# Patient Record
Sex: Male | Born: 1975 | Race: White | Hispanic: No | Marital: Married | Smoking: Current every day smoker
Health system: Southern US, Community
[De-identification: ages and names within clinical notes are randomized; demographics above are authoritative.]

## PROBLEM LIST (undated history)

## (undated) DIAGNOSIS — T7840XA Allergy, unspecified, initial encounter: Secondary | ICD-10-CM

## (undated) DIAGNOSIS — F319 Bipolar disorder, unspecified: Secondary | ICD-10-CM

## (undated) HISTORY — DX: Allergy, unspecified, initial encounter: T78.40XA

## (undated) HISTORY — PX: HERNIA REPAIR: SHX51

## (undated) HISTORY — DX: Bipolar disorder, unspecified: F31.9

---

## 2012-09-27 ENCOUNTER — Encounter (HOSPITAL_BASED_OUTPATIENT_CLINIC_OR_DEPARTMENT_OTHER): Payer: Self-pay | Admitting: *Deleted

## 2012-09-27 ENCOUNTER — Emergency Department (HOSPITAL_BASED_OUTPATIENT_CLINIC_OR_DEPARTMENT_OTHER)
Admission: EM | Admit: 2012-09-27 | Discharge: 2012-09-28 | Disposition: A | Discharge status: Home / Self Care | Payer: Federal, State, Local not specified - PPO | Attending: Emergency Medicine | Admitting: Emergency Medicine

## 2012-09-27 DIAGNOSIS — IMO0002 Reserved for concepts with insufficient information to code with codable children: Secondary | ICD-10-CM | POA: Insufficient documentation

## 2012-09-27 DIAGNOSIS — Y93G9 Activity, other involving cooking and grilling: Secondary | ICD-10-CM | POA: Insufficient documentation

## 2012-09-27 DIAGNOSIS — W268XXA Contact with other sharp object(s), not elsewhere classified, initial encounter: Secondary | ICD-10-CM | POA: Insufficient documentation

## 2012-09-27 DIAGNOSIS — Y9289 Other specified places as the place of occurrence of the external cause: Secondary | ICD-10-CM | POA: Insufficient documentation

## 2012-09-27 DIAGNOSIS — Z23 Encounter for immunization: Secondary | ICD-10-CM | POA: Insufficient documentation

## 2012-09-27 DIAGNOSIS — S68119A Complete traumatic metacarpophalangeal amputation of unspecified finger, initial encounter: Secondary | ICD-10-CM

## 2012-09-27 DIAGNOSIS — Z79899 Other long term (current) drug therapy: Secondary | ICD-10-CM | POA: Insufficient documentation

## 2012-09-27 NOTE — ED Notes (Signed)
Patient cut his finger on a vegetable slicer, tip of his right middle finger

## 2012-09-27 NOTE — ED Notes (Signed)
Pt avulsed the tip of his right third finger on a vegetable slicer. Bandage is stuck to same. Finger is soaking in betadine/water solution to loosen bandage.

## 2012-09-28 MED ORDER — CEPHALEXIN 250 MG PO CAPS
1000.0000 mg | ORAL_CAPSULE | Freq: Once | ORAL | Status: AC
  Administered 2012-09-28: 1000 mg via ORAL
  Filled 2012-09-28: qty 4

## 2012-09-28 MED ORDER — CEPHALEXIN 500 MG PO CAPS: 500.0000 mg | ORAL_CAPSULE | Freq: Four times a day (QID) | ORAL | Status: DC

## 2012-09-28 MED ORDER — TETANUS-DIPHTH-ACELL PERTUSSIS 5-2.5-18.5 LF-MCG/0.5 IM SUSP
0.5000 mL | Freq: Once | INTRAMUSCULAR | Status: AC
  Administered 2012-09-28: 0.5 mL via INTRAMUSCULAR
  Filled 2012-09-28: qty 0.5

## 2012-09-28 NOTE — ED Notes (Signed)
MD at bedside. 

## 2012-09-28 NOTE — ED Provider Notes (Signed)
  CSN: [id number]     Arrival date & time 09/27/12  2146 History     First MD Initiated Contact with Patient 09/28/12 0018     Chief Complaint  Patient presents with  . Finger Injury   (Consider location/radiation/quality/duration/timing/severity/associated sxs/prior Treatment) HPI This is a 37 year old male who sliced the tip of his right index finger off with a vegetable slicer just prior to arrival. There was moderate pain at the time but that is improved. Bleeding has been controlled. The wound was cleansed by nursing staff prior to my violation. His tetanus is up-to-date. The wound is superficial and does not impair movement of his finger. He denies other injury.  History reviewed. No pertinent past medical history. Past Surgical History  Procedure Laterality Date  . Hernia repair     No family history on file. History  Substance Use Topics  . Smoking status: Never Smoker   . Smokeless tobacco: Not on file  . Alcohol Use: No    Review of Systems  All other systems reviewed and are negative.    Allergies  Review of patient's allergies indicates no known allergies.  Home Medications   Current Outpatient Rx  Name  Route  Sig  Dispense  Refill  . montelukast (SINGULAIR) 10 MG tablet   Oral   Take 10 mg by mouth at bedtime.          BP 135/87  Pulse 73  Temp(Src) 98.2 F (36.8 C) (Oral)  Resp 18  SpO2 98%  Physical Exam General: Well-developed, well-nourished male in no acute distress; appearance consistent with age of record HENT: normocephalic, atraumatic Eyes: normal appearing Neck: supple Heart: regular rate and rhythm Lungs: clear to auscultation bilaterally Abdomen: soft; nondistended; nontender; bowel sounds present Extremities: No deformity; full range of motion Neurologic: Awake, alert and oriented; motor function intact in all extremities and symmetric; no facial droop Skin: Warm and dry; superficial tip amputation of right middle  finger Psychiatric: Normal mood and affect    ED Course   Procedures (including critical care time)    MDM    Hanley Seamen, MD 09/28/12 4098

## 2012-09-28 NOTE — ED Notes (Signed)
Per Dr. Read Drivers, wound covered with xeroform and gauze then wrapped with kerlex.

## 2016-04-06 ENCOUNTER — Ambulatory Visit (INDEPENDENT_AMBULATORY_CARE_PROVIDER_SITE_OTHER): Payer: Federal, State, Local not specified - PPO | Admitting: Family Medicine

## 2016-04-06 ENCOUNTER — Other Ambulatory Visit: Payer: Self-pay | Admitting: Family Medicine

## 2016-04-06 ENCOUNTER — Encounter: Payer: Self-pay | Admitting: Family Medicine

## 2016-04-06 VITALS — BP 128/81 | HR 78 | Temp 98.2°F | Ht 75.0 in | Wt 218.0 lb

## 2016-04-06 DIAGNOSIS — W57XXXA Bitten or stung by nonvenomous insect and other nonvenomous arthropods, initial encounter: Secondary | ICD-10-CM | POA: Diagnosis not present

## 2016-04-06 DIAGNOSIS — M795 Residual foreign body in soft tissue: Secondary | ICD-10-CM | POA: Diagnosis not present

## 2016-04-06 NOTE — Progress Notes (Signed)
Pre visit review using our clinic review tool, if applicable. No additional management support is needed unless otherwise documented below in the visit note. 

## 2016-04-06 NOTE — Patient Instructions (Signed)
Do not shower for the rest of the day. When you do wash it, use only soap and water. Do not vigorously scrub. Apply triple antibiotic ointment (like Neosporin) twice daily. Keep the area clean and dry.   Things to look out for: increasing pain not relieved by ibuprofen/acetaminophen, fevers, spreading redness, drainage of pus, or foul odor.  

## 2016-04-06 NOTE — Progress Notes (Signed)
Chief Complaint  Patient presents with  . Establish Care    Pt reports having tick bite LT under arm and has not been removed x2 days       New Patient Visit SUBJECTIVE: HPI: Christian Baldwin is an 41 y.o.male who is being seen for establishing care.  The patient noticed a tick stuck to his L armpit region around 2 days ago. He has had tick bites in the past, but notes that it resolves much sooner. He has not noticed a "bulls-eye" rash, fevers, or drainage. His wife tried to take off the tick, but failed and now he believes it is burrowed underneath his skin. He would like it taken out. [place] and denies recent travel to an area endemic with Lyme Disease.  No Known Allergies  Past Medical History:  Diagnosis Date  . Allergy    Past Surgical History:  Procedure Laterality Date  . HERNIA REPAIR     Social History   Social History  . Marital status: Married   Social History Main Topics  . Smoking status: Never Smoker  . Smokeless tobacco: Never Used  . Alcohol use No  . Drug use: Unknown   Family History  Problem Relation Age of Onset  . Cancer Neg Hx      Current Outpatient Prescriptions:  .  albuterol (PROVENTIL HFA;VENTOLIN HFA) 108 (90 Base) MCG/ACT inhaler, Inhale 1 puff into the lungs as needed., Disp: , Rfl:  .  fluticasone (FLONASE) 50 MCG/ACT nasal spray, Place 1 spray into the nose daily., Disp: , Rfl:  .  montelukast (SINGULAIR) 10 MG tablet, Take 10 mg by mouth at bedtime., Disp: , Rfl:   ROS Skin:  As noted in HPI  Const: Denies fevers   OBJECTIVE: BP 128/81 (BP Location: Left Arm, Patient Position: Sitting, Cuff Size: Small)   Pulse 78   Temp 98.2 F (36.8 C) (Oral)   Ht 6\' 3"  (1.905 m)   Wt 218 lb (98.9 kg)   SpO2 97%   BMI 27.25 kg/m   Constitutional: -  VS reviewed -  Well developed, well nourished, appears stated age -  No apparent distress  Psychiatric: -  Oriented to person, place, and time -  Memory intact -   Affect and mood normal -  Fluent conversation, good eye contact -  Judgment and insight age appropriate  Eye: -  Conjunctivae clear, no discharge -  Pupils symmetric, round, reactive to light  Cardiovascular: -  RRR, no murmurs -  No LE edema  Respiratory: -  Normal respiratory effort, no accessory muscle use, no retraction -  Breath sounds equal, no wheezes, no ronchi, no crackles  Musculoskeletal: -  No clubbing, no cyanosis -  Gait normal  Skin: -  Over L axillary region, there is an area of edema and erythema. There is a central area with a small amount of black in view. I do not appreciate any fluctuance or drainage. No significant TTP. -  Warm and dry to palpation   Procedure note; FB removal Informed consent was obtained. The area was cleaned with alcohol and injected with 4 mL of 1% lidocaine without epinephrine. The area was then cleaned with betadine. A sterile field was then created with a fenestrated drape. Sterile gloves used. A 15 blade scalpel was then used to make an elliptical excision around the area of interest. Forceps were used to create traction to enhance dissection of the tissue planes and removal of the  specimen. Adequate hemostasis assured with electrical cauterization. 3 Simple 5-0 nylon sutures were placed with good approximation of wound edges. The area was dressed with triple antibiotic ointment and a bandage. There were no complications noted. The patient tolerated the procedure well.   ASSESSMENT/PLAN: Tick bite, initial encounter  Foreign body (FB) in soft tissue - Plan: PR REMOVAL OF FOREIGN BODY  As we live in an area with low incidence of Lyme Disease, no indication for bacterial prophylaxis. I did not think I could adequately remove the remnants of the tick without excision. Patient should return in 1 week for suture removal. The patient voiced understanding and agreement to the plan.   Jilda Roche [place], DO 04/06/16  4:07 PM

## 2016-04-13 ENCOUNTER — Ambulatory Visit (INDEPENDENT_AMBULATORY_CARE_PROVIDER_SITE_OTHER): Payer: Federal, State, Local not specified - PPO | Admitting: Family Medicine

## 2016-04-13 ENCOUNTER — Encounter: Payer: Self-pay | Admitting: Family Medicine

## 2016-04-13 VITALS — BP 108/70 | HR 82 | Temp 98.3°F | Ht 75.0 in | Wt 218.4 lb

## 2016-04-13 DIAGNOSIS — M795 Residual foreign body in soft tissue: Secondary | ICD-10-CM

## 2016-04-13 NOTE — Progress Notes (Signed)
Pt here for suture removal. It appears the area had opened up more than when I saw him last week. I am worried it will splay more if I remove the center suture. Will have him return in 1 week for removal. Recommended continuing triple abx ointment and keeping area clean and dry. Butterfly bandages also recommended.   Pt voiced understanding and agreement to the plan.   Jilda Rocheicholas [place], DO 04/13/16 2:13 PM

## 2016-04-13 NOTE — Progress Notes (Signed)
Pre visit review using our clinic review tool, if applicable. No additional management support is needed unless otherwise documented below in the visit note. 

## 2016-04-20 ENCOUNTER — Ambulatory Visit (INDEPENDENT_AMBULATORY_CARE_PROVIDER_SITE_OTHER): Payer: Federal, State, Local not specified - PPO | Admitting: Family Medicine

## 2016-04-20 DIAGNOSIS — Z4802 Encounter for removal of sutures: Secondary | ICD-10-CM

## 2016-04-20 NOTE — Progress Notes (Signed)
Pt here for suture removal. It did splay open a little bit, probably due to increased traction in area. No erythema, drainage, or fluctuance. 3 simple sutures removed. Pt tolerated well. No complications. Follow up as needed.  Pt voiced understanding and agreement to the plan.  Jilda Rocheicholas Paul [place], DO 04/20/16 12:01 PM

## 2016-04-20 NOTE — Progress Notes (Signed)
Pre visit review using our clinic review tool, if applicable. No additional management support is needed unless otherwise documented below in the visit note. 

## 2017-01-07 ENCOUNTER — Encounter: Payer: Self-pay | Admitting: Family Medicine

## 2017-01-07 ENCOUNTER — Ambulatory Visit (INDEPENDENT_AMBULATORY_CARE_PROVIDER_SITE_OTHER): Payer: Federal, State, Local not specified - PPO | Admitting: Family Medicine

## 2017-01-07 VITALS — BP 108/70 | HR 77 | Temp 98.1°F | Ht 75.0 in | Wt 207.4 lb

## 2017-01-07 DIAGNOSIS — Z0000 Encounter for general adult medical examination without abnormal findings: Secondary | ICD-10-CM | POA: Diagnosis not present

## 2017-01-07 LAB — COMPREHENSIVE METABOLIC PANEL
ALBUMIN: 4.7 g/dL (ref 3.5–5.2)
ALK PHOS: 87 U/L (ref 39–117)
ALT: 67 U/L — ABNORMAL HIGH (ref 0–53)
AST: 32 U/L (ref 0–37)
BILIRUBIN TOTAL: 0.7 mg/dL (ref 0.2–1.2)
BUN: 12 mg/dL (ref 6–23)
CO2: 29 mEq/L (ref 19–32)
Calcium: 9.7 mg/dL (ref 8.4–10.5)
Chloride: 104 mEq/L (ref 96–112)
Creatinine, Ser: 0.78 mg/dL (ref 0.40–1.50)
GFR: 117.42 mL/min (ref >60.00)
Glucose, Bld: 93 mg/dL (ref 70–99)
Potassium: 4.2 mEq/L (ref 3.5–5.1)
SODIUM: 138 meq/L (ref 135–145)
TOTAL PROTEIN: 7.4 g/dL (ref 6.0–8.3)

## 2017-01-07 LAB — LIPID PANEL
CHOL/HDL RATIO: 5
CHOLESTEROL: 186 mg/dL (ref 0–200)
HDL: 39.9 mg/dL (ref >39.00)
LDL CALC: 130 mg/dL — AB (ref 0–99)
NonHDL: 145.87
Triglycerides: 80 mg/dL (ref 0.0–149.0)
VLDL: 16 mg/dL (ref 0.0–40.0)

## 2017-01-07 LAB — CBC
HEMATOCRIT: 47.8 % (ref 39.0–52.0)
Hemoglobin: 16.1 g/dL (ref 13.0–17.0)
MCHC: 33.6 g/dL (ref 30.0–36.0)
MCV: 94.9 fl (ref 78.0–100.0)
Platelets: 187 10*3/uL (ref 150.0–400.0)
RBC: 5.04 Mil/uL (ref 4.22–5.81)
RDW: 12.4 % (ref 11.5–15.5)
WBC: 7.1 10*3/uL (ref 4.0–10.5)

## 2017-01-07 MED ORDER — FLUTICASONE PROPIONATE 50 MCG/ACT NA SUSP: 2.0000 | Freq: Every day | NASAL | 12 refills | Status: DC

## 2017-01-07 NOTE — Progress Notes (Signed)
Pre visit review using our clinic review tool, if applicable. No additional management support is needed unless otherwise documented below in the visit note. 

## 2017-01-07 NOTE — Progress Notes (Signed)
Chief Complaint  Patient presents with  . Annual Exam    sleep problems    Well Male Christian Baldwin is here for a complete physical.   His last physical was >1 year ago.  Current diet: in general, a "healthy" diet   Current exercise: Walking, carrying firewood Weight trend: stable Does pt snore? No. Daytime fatigue? No. Seat belt? Yes.    Health maintenance Tetanus- Yes HIV- Yes  Past Medical History:  Diagnosis Date  . Allergy     Past Surgical History:  Procedure Laterality Date  . HERNIA REPAIR     Medications  Current Outpatient Medications on File Prior to Visit  Medication Sig Dispense Refill  . albuterol (PROVENTIL HFA;VENTOLIN HFA) 108 (90 Base) MCG/ACT inhaler Inhale 1 puff into the lungs as needed.    . fluticasone (FLONASE) 50 MCG/ACT nasal spray Place 1 spray into the nose daily.    . montelukast (SINGULAIR) 10 MG tablet Take 10 mg by mouth at bedtime.     Allergies No Known Allergies   Family History Family History  Problem Relation Age of Onset  . Cancer Neg Hx     Review of Systems: Constitutional: no fevers or chills Eye:  no recent significant change in vision Ear/Nose/Mouth/Throat:  Ears: +tinnitus, no hearing loss Nose/Mouth/Throat:  no complaints of nasal congestion or bleeding, no sore throat Cardiovascular:  no chest pain, no palpitations Respiratory:  no cough and no shortness of breath Gastrointestinal:  no abdominal pain, no change in bowel habits, no nausea, vomiting, diarrhea, or constipation and no black or bloody stool GU:  Male: negative for dysuria, frequency, and incontinence and negative for prostate symptoms Musculoskeletal/Extremities:  no pain, redness, or swelling of the joints Integumentary (Skin/Breast):  no abnormal skin lesions reported Neurologic:  no headaches, no numbness, tingling Endocrine: No unexpected weight changes Hematologic/Lymphatic:  no night sweats  Exam BP 108/70 (BP Location: Left Arm, Patient  Position: Sitting, Cuff Size: Large)   Pulse 77   Temp 98.1 F (36.7 C) (Oral)   Ht 6\' 3"  (1.905 m)   Wt 207 lb 6 oz (94.1 kg)   SpO2 96%   BMI 25.92 kg/m  General:  well developed, well nourished, in no apparent distress Skin:  no significant moles, warts, or growths Head:  no masses, lesions, or tenderness Eyes:  pupils equal and round, sclera anicteric without injection Ears:  canals without lesions, TMs shiny without retraction, no obvious effusion, no erythema Nose:  nares patent, septum midline, mucosa normal Throat/Pharynx:  lips and gingiva without lesion; tongue and uvula midline; non-inflamed pharynx; no exudates or postnasal drainage Neck: neck supple without adenopathy, thyromegaly, or masses Lungs:  clear to auscultation, breath sounds equal bilaterally, no respiratory distress Cardio:  regular rate and rhythm without murmurs, heart sounds without clicks or rubs Abdomen:  abdomen soft, nontender; bowel sounds normal; no masses or organomegaly Genital (male): Nml penis, no lesions or discharge; testes present bilaterally without masses or tenderness Rectal: Deferred Musculoskeletal:  symmetrical muscle groups noted without atrophy or deformity Extremities:  no clubbing, cyanosis, or edema, no deformities, no skin discoloration Neuro:  gait normal; deep tendon reflexes normal and symmetric Psych: well oriented with normal range of affect and appropriate judgment/insight  Assessment and Plan  Well adult exam - Plan: Lipid panel, CBC, Comprehensive metabolic panel   Well 41 y.o. male. Counseled on diet and exercise. Other orders as above. Follow up in 1 year pending the above workup. The patient voiced understanding and  agreement to the plan.  Jilda Rocheicholas [place], DO 01/07/17 12:09 PM

## 2017-01-07 NOTE — Patient Instructions (Addendum)
Coping skills Choose 5 that work for you:  Take a deep breath  Count to 20  Read a book  Do a puzzle  Meditate  Bake  Sing  Knit  Garden  Pray  Go outside  Call a friend  Listen to music  Take a walk  Color  Send a note  Take a bath  Watch a movie  Be alone in a quiet place  Pet an animal  Visit a friend  Journal  Exercise  Stretch   Give us 2-3 business days to get the results of your labs back. If labs are normal, you will likely receive a letter in the mail unless you have MyChart. This can take longer than 2-3 business days.   Let us know if you need anything.

## 2017-01-08 ENCOUNTER — Other Ambulatory Visit: Payer: Self-pay | Admitting: Family Medicine

## 2017-01-08 DIAGNOSIS — R740 Nonspecific elevation of levels of transaminase and lactic acid dehydrogenase [LDH]: Principal | ICD-10-CM

## 2017-01-08 DIAGNOSIS — R7401 Elevation of levels of liver transaminase levels: Secondary | ICD-10-CM

## 2017-01-15 ENCOUNTER — Other Ambulatory Visit (INDEPENDENT_AMBULATORY_CARE_PROVIDER_SITE_OTHER): Payer: Federal, State, Local not specified - PPO

## 2017-01-15 ENCOUNTER — Other Ambulatory Visit: Payer: Self-pay | Admitting: Family Medicine

## 2017-01-15 DIAGNOSIS — R740 Nonspecific elevation of levels of transaminase and lactic acid dehydrogenase [LDH]: Secondary | ICD-10-CM | POA: Diagnosis not present

## 2017-01-15 DIAGNOSIS — R7401 Elevation of levels of liver transaminase levels: Secondary | ICD-10-CM

## 2017-01-15 LAB — HEPATIC FUNCTION PANEL
ALBUMIN: 4.7 g/dL (ref 3.5–5.2)
ALT: 65 U/L — AB (ref 0–53)
AST: 30 U/L (ref 0–37)
Alkaline Phosphatase: 92 U/L (ref 39–117)
Bilirubin, Direct: 0.1 mg/dL (ref 0.0–0.3)
TOTAL PROTEIN: 7.2 g/dL (ref 6.0–8.3)
Total Bilirubin: 0.8 mg/dL (ref 0.2–1.2)

## 2017-01-15 NOTE — Progress Notes (Signed)
Elevated ALT labs ordered. Pt to be notified.

## 2017-01-20 ENCOUNTER — Other Ambulatory Visit: Payer: Self-pay | Admitting: Family Medicine

## 2017-01-20 DIAGNOSIS — R740 Nonspecific elevation of levels of transaminase and lactic acid dehydrogenase [LDH]: Principal | ICD-10-CM

## 2017-01-20 DIAGNOSIS — R7401 Elevation of levels of liver transaminase levels: Secondary | ICD-10-CM

## 2017-01-25 ENCOUNTER — Other Ambulatory Visit (INDEPENDENT_AMBULATORY_CARE_PROVIDER_SITE_OTHER): Payer: Federal, State, Local not specified - PPO

## 2017-01-25 DIAGNOSIS — R7401 Elevation of levels of liver transaminase levels: Secondary | ICD-10-CM

## 2017-01-25 DIAGNOSIS — R740 Nonspecific elevation of levels of transaminase and lactic acid dehydrogenase [LDH]: Secondary | ICD-10-CM

## 2017-01-25 LAB — IBC PANEL
Iron: 100 ug/dL (ref 42–165)
SATURATION RATIOS: 30.4 % (ref 20.0–50.0)
Transferrin: 235 mg/dL (ref 212.0–360.0)

## 2017-01-25 LAB — FERRITIN: Ferritin: 319.1 ng/mL (ref 22.0–322.0)

## 2017-01-26 LAB — HEPATITIS C ANTIBODY
HEP C AB: NONREACTIVE
SIGNAL TO CUT-OFF: 0.02 (ref <1.00)

## 2017-01-26 LAB — HEPATITIS B SURFACE ANTIGEN: Hepatitis B Surface Ag: NONREACTIVE

## 2017-02-15 ENCOUNTER — Other Ambulatory Visit: Payer: Self-pay | Admitting: Family Medicine

## 2017-03-18 ENCOUNTER — Telehealth: Payer: Self-pay | Admitting: *Deleted

## 2017-03-18 NOTE — Telephone Encounter (Signed)
[facility]; forwarded to [place] for email/scan/SLS 02/07

## 2017-09-24 ENCOUNTER — Ambulatory Visit: Payer: Federal, State, Local not specified - PPO | Admitting: Family Medicine

## 2017-09-24 ENCOUNTER — Encounter: Payer: Self-pay | Admitting: Family Medicine

## 2017-09-24 VITALS — BP 102/74 | HR 75 | Temp 98.4°F | Ht 75.0 in | Wt 204.5 lb

## 2017-09-24 DIAGNOSIS — G4700 Insomnia, unspecified: Secondary | ICD-10-CM | POA: Diagnosis not present

## 2017-09-24 MED ORDER — TRAZODONE HCL 50 MG PO TABS: 25.0000 mg | ORAL_TABLET | Freq: Every evening | ORAL | 3 refills | Status: DC | PRN

## 2017-09-24 NOTE — Progress Notes (Signed)
Chief Complaint  Patient presents with  . Insomnia    Subjective: Patient is a 42 y.o. male here for sleep issues.  Has been going on for several mo, unsure exactly when it started. He is able to fall asleep OK but will wake up around 330 Am each morning. Over the past week it has been getting earlier. Goes to sleep around 1030 and wakes up at 530. Mind will be racing when he wakes up. Unsure if it is anxiety. Sometimes will worry while awake.  He does have a family history of sleep apnea.  He does snore.  No apneic episodes.  Prior to this, he would feel rested.  ROS: Psych: +insomnia  Past Medical History:  Diagnosis Date  . Allergy     Objective: BP 102/74 (BP Location: Left Arm, Patient Position: Sitting, Cuff Size: Normal)   Pulse 75   Temp 98.4 F (36.9 C) (Oral)   Ht 6\' 3"  (1.905 m)   Wt 204 lb 8 oz (92.8 kg)   SpO2 96%   BMI 25.56 kg/m  General: Awake, appears stated age HEENT: MMM, EOMi Heart: RRR, no murmurs Lungs: CTAB, no rales, wheezes or rhonchi. No accessory muscle use Psych: Age appropriate judgment and insight, normal affect and mood  Assessment and Plan: Insomnia, unspecified type - Plan: traZODone (DESYREL) 50 MG tablet  Orders as above.  Start trazodone at bedtime as needed. CBT information provided. Sleep hygiene discussed and written down in AVS. Follow-up in 6 weeks. The patient voiced understanding and agreement to the plan.  Jilda Rocheicholas Paul [place], DO 09/24/17  11:06 AM

## 2017-09-24 NOTE — Patient Instructions (Signed)
Sleep is important to us all. Getting good sleep is imperative to adequate functioning during the day. Work with our counselors who are trained to help people obtain quality sleep. Call [phone] to schedule an appointment or if you are curious about insurance coverage/cost.  Sleep Hygiene Tips:  Do not watch TV or look at screens within 1 hour of going to bed. If you do, make sure there is a blue light filter (nighttime mode) involved.  Try to go to bed around the same time every night. Wake up at the same time within 1 hour of regular time. Ex: If you wake up at 7 AM for work, do not sleep past 8 AM on days that you don't work.  Do not drink alcohol before bedtime.  Do not consume caffeine-containing beverages after noon or within 9 hours of intended bedtime.  Get regular exercise/physical activity in your life, but not within 2 hours of planned bedtime.  Do not take naps.   Do not eat within 2 hours of planned bedtime.  Melatonin, 3-5 mg 30-60 minutes before planned bedtime may be helpful.   The bed should be for sleep or sex only. If after 20-30 minutes you are unable to fall asleep, get up and do something relaxing. Do this until you feel ready to go to sleep again.   Let us know if you need anything.  

## 2017-09-24 NOTE — Progress Notes (Signed)
Pre visit review using our clinic review tool, if applicable. No additional management support is needed unless otherwise documented below in the visit note. 

## 2017-10-13 ENCOUNTER — Encounter: Payer: Self-pay | Admitting: Family Medicine

## 2017-11-03 ENCOUNTER — Ambulatory Visit: Payer: Federal, State, Local not specified - PPO | Admitting: Family Medicine

## 2017-11-03 ENCOUNTER — Encounter: Payer: Self-pay | Admitting: Family Medicine

## 2017-11-03 ENCOUNTER — Other Ambulatory Visit: Payer: Self-pay | Admitting: Family Medicine

## 2017-11-03 VITALS — BP 104/80 | HR 91 | Temp 98.2°F | Resp 16 | Wt 199.0 lb

## 2017-11-03 DIAGNOSIS — G4700 Insomnia, unspecified: Secondary | ICD-10-CM

## 2017-11-03 DIAGNOSIS — R631 Polydipsia: Secondary | ICD-10-CM | POA: Diagnosis not present

## 2017-11-03 DIAGNOSIS — R5383 Other fatigue: Secondary | ICD-10-CM

## 2017-11-03 DIAGNOSIS — R0683 Snoring: Secondary | ICD-10-CM

## 2017-11-03 DIAGNOSIS — R7989 Other specified abnormal findings of blood chemistry: Secondary | ICD-10-CM

## 2017-11-03 LAB — TSH: TSH: 1.07 u[IU]/mL (ref 0.35–4.50)

## 2017-11-03 LAB — COMPREHENSIVE METABOLIC PANEL
ALT: 48 U/L (ref 0–53)
AST: 24 U/L (ref 0–37)
Albumin: 4.9 g/dL (ref 3.5–5.2)
Alkaline Phosphatase: 94 U/L (ref 39–117)
BILIRUBIN TOTAL: 0.9 mg/dL (ref 0.2–1.2)
BUN: 13 mg/dL (ref 6–23)
CALCIUM: 9.8 mg/dL (ref 8.4–10.5)
CO2: 26 meq/L (ref 19–32)
Chloride: 104 mEq/L (ref 96–112)
Creatinine, Ser: 0.74 mg/dL (ref 0.40–1.50)
GFR: 124.25 mL/min (ref >60.00)
Glucose, Bld: 85 mg/dL (ref 70–99)
Potassium: 3.9 mEq/L (ref 3.5–5.1)
Sodium: 139 mEq/L (ref 135–145)
Total Protein: 7.4 g/dL (ref 6.0–8.3)

## 2017-11-03 LAB — TESTOSTERONE: Testosterone: 155.33 ng/dL — ABNORMAL LOW (ref 300.00–890.00)

## 2017-11-03 LAB — CBC
HEMATOCRIT: 45.9 % (ref 39.0–52.0)
Hemoglobin: 16.1 g/dL (ref 13.0–17.0)
MCHC: 35.2 g/dL (ref 30.0–36.0)
MCV: 89.9 fl (ref 78.0–100.0)
PLATELETS: 173 10*3/uL (ref 150.0–400.0)
RBC: 5.11 Mil/uL (ref 4.22–5.81)
RDW: 11.8 % (ref 11.5–15.5)
WBC: 7.4 10*3/uL (ref 4.0–10.5)

## 2017-11-03 LAB — HEMOGLOBIN A1C: Hgb A1c MFr Bld: 5.2 % (ref 4.6–6.5)

## 2017-11-03 MED ORDER — FLUTICASONE PROPIONATE 50 MCG/ACT NA SUSP: 2.0000 | Freq: Every day | NASAL | 2 refills | Status: DC

## 2017-11-03 MED ORDER — SODIUM CHLORIDE 3 % IN NEBU
INHALATION_SOLUTION | RESPIRATORY_TRACT | 12 refills | Status: DC | PRN
Start: 2017-11-03 — End: 2020-11-05

## 2017-11-03 MED ORDER — SUVOREXANT 10 MG PO TABS: 1.0000 | ORAL_TABLET | Freq: Every day | ORAL | 1 refills | Status: DC

## 2017-11-03 MED ORDER — SERTRALINE HCL 50 MG PO TABS: 50.0000 mg | ORAL_TABLET | Freq: Every day | ORAL | 3 refills | Status: DC

## 2017-11-03 MED ORDER — MONTELUKAST SODIUM 10 MG PO TABS: 10.0000 mg | ORAL_TABLET | Freq: Every day | ORAL | 1 refills | Status: DC

## 2017-11-03 MED ORDER — ALBUTEROL SULFATE HFA 108 (90 BASE) MCG/ACT IN AERS: 1.0000 | INHALATION_SPRAY | Freq: Four times a day (QID) | RESPIRATORY_TRACT | 2 refills | Status: DC | PRN

## 2017-11-03 NOTE — Patient Instructions (Addendum)
Give Christian Baldwin 2-3 business days to get the results of your labs back.   Sleep is important to Christian Baldwin all. Getting good sleep is imperative to adequate functioning during the day. Work with our counselors who are trained to help people obtain quality sleep. Call [phone] to schedule an appointment or if you are curious about insurance coverage/cost.  If you do not hear anything about your referral in the next 1-2 weeks, call our office and ask for an update.  Let Christian Baldwin know if you need anything.

## 2017-11-03 NOTE — Progress Notes (Signed)
CC: Insomnia  Subjective: Patient is a 42 y.o. male here for f/u insomnia. Here w wife.  This is a f/u visit, has been going on for around 6-7 mo. Was seen around 6 weeks ago and rx'd Trazodone and [facility] info given as well as sleep hygiene. Thea Silversmith was not helpful. Little time for [facility]. Sleep poor. Wife notes he does snore. Wakes up feeling like he has not slept at all. It is affecting who he is as a person. Interested in having labs in addition to seeing a sleep specialist. Notes he took his mother's Ambien on one occasion and does not believe it was particularly helpful. He is losing weight. Falls asleep OK, wakes up a few hours later and will have racing thoughts preventing him from falling back asleep.  ROS: Psych: as noted in HPI Const: +weight loss  Past Medical History:  Diagnosis Date  . Allergy     Objective: BP 104/80   Pulse 91   Temp 98.2 F (36.8 C) (Oral)   Resp 16   Wt 199 lb (90.3 kg)   SpO2 98%   BMI 24.87 kg/m  General: Awake, appears stated age HEENT: MMM, EOMi, Mallampati 3 Heart: RRR, no LE edema Lungs: CTAB, no rales, wheezes or rhonchi. No accessory muscle use Psych: Age appropriate judgment and insight, normal affect and mood  Assessment and Plan: Insomnia, unspecified type - Plan: Suvorexant (BELSOMRA) 10 MG TABS, sertraline (ZOLOFT) 50 MG tablet, Ambulatory referral to Pulmonology  Other fatigue - Plan: TSH, Comprehensive metabolic panel, CBC, Testosterone  Polydipsia - Plan: Hemoglobin A1c  Snoring - Plan: Ambulatory referral to Pulmonology  Orders as above. I believe there is an underlying anxiety component. Will start tx. Encouraged him to follow with [facility] if able.  Belsomra for as needed help. Interested to see if he has OSA and if the sleep team can add any benefit. He is not interested in anything with potential addictive consequences. F/u in 1 mo. The patient and his wife voiced understanding and agreement to the plan.  Jilda Roche  [place], DO 11/03/17  10:32 AM

## 2017-11-04 ENCOUNTER — Telehealth: Payer: Self-pay

## 2017-11-04 NOTE — Telephone Encounter (Signed)
Author phoned pt. to set up lab appointment in 2 weeks per Dr. Carmelia Roller. Lab appointment made for 10/9 at 0840AM.

## 2017-11-04 NOTE — Telephone Encounter (Signed)
-----   Message from Sharlene Dory, DO sent at 11/03/2017  4:30 PM EDT ----- T is low. Will check Free T and if low, will start replacement.   RE- please schedule pt for lab in 2 weeks in AM. TY.

## 2017-11-15 ENCOUNTER — Ambulatory Visit: Payer: Federal, State, Local not specified - PPO | Admitting: Family Medicine

## 2017-11-17 ENCOUNTER — Other Ambulatory Visit (INDEPENDENT_AMBULATORY_CARE_PROVIDER_SITE_OTHER): Payer: Federal, State, Local not specified - PPO

## 2017-11-17 DIAGNOSIS — R7989 Other specified abnormal findings of blood chemistry: Secondary | ICD-10-CM | POA: Diagnosis not present

## 2017-11-19 ENCOUNTER — Encounter: Payer: Self-pay | Admitting: Family Medicine

## 2017-11-23 LAB — TESTOSTERONE, FREE: TESTOSTERONE FREE: 25.1 pg/mL — ABNORMAL LOW (ref 46.0–224.0)

## 2017-11-26 ENCOUNTER — Other Ambulatory Visit: Payer: Self-pay | Admitting: Family Medicine

## 2017-11-26 DIAGNOSIS — E349 Endocrine disorder, unspecified: Secondary | ICD-10-CM

## 2017-12-03 ENCOUNTER — Encounter: Payer: Self-pay | Admitting: Family Medicine

## 2017-12-03 ENCOUNTER — Ambulatory Visit: Payer: Federal, State, Local not specified - PPO | Admitting: Family Medicine

## 2017-12-03 VITALS — BP 120/80 | HR 82 | Temp 98.7°F | Ht 75.0 in | Wt 198.1 lb

## 2017-12-03 DIAGNOSIS — G4700 Insomnia, unspecified: Secondary | ICD-10-CM

## 2017-12-03 DIAGNOSIS — R7989 Other specified abnormal findings of blood chemistry: Secondary | ICD-10-CM | POA: Diagnosis not present

## 2017-12-03 DIAGNOSIS — F411 Generalized anxiety disorder: Secondary | ICD-10-CM | POA: Diagnosis not present

## 2017-12-03 NOTE — Progress Notes (Signed)
Pre visit review using our clinic review tool, if applicable. No additional management support is needed unless otherwise documented below in the visit note. 

## 2017-12-03 NOTE — Progress Notes (Signed)
Chief Complaint  Patient presents with  . Insomnia    Subjective: Patient is a 42 y.o. male here for insomnia.  Started on Zoloft and Belsomra. Since that time, his racing thoughts have drastically improved.  He is now sleeping more.  He will wake up in the middle the night, however is able to fall asleep whereas before he was up for hours at a time.  He still has fatigue in the afternoon and evenings.  He was diagnosed with testosterone deficiency and has his first appointment with endocrinology team in mid November.  He is not having any side effects with his medication.  He is not currently following with a counselor or psychologist.  ROS: Psych: As noted in HPI  Past Medical History:  Diagnosis Date  . Allergy     Objective: BP 120/80 (BP Location: Left Arm, Patient Position: Sitting, Cuff Size: Normal)   Pulse 82   Temp 98.7 F (37.1 C) (Oral)   Ht 6\' 3"  (1.905 m)   Wt 198 lb 2 oz (89.9 kg)   SpO2 97%   BMI 24.76 kg/m  General: Awake, appears stated age HEENT: MMM, EOMi Heart: RRR, no lower extremity edema Lungs: CTAB, no rales, wheezes or rhonchi. No accessory muscle use Psych: Age appropriate judgment and insight, normal affect and mood  Assessment and Plan: GAD (generalized anxiety disorder)  Insomnia, unspecified type - Plan: sertraline (ZOLOFT) 50 MG tablet  Low testosterone in male  We will stop Belsomra and see how he does.  This is likely related more to his generalized anxiety disorder.  Hold off on starting testosterone replacement therapy as he is electing to see endocrine allergy. Follow-up in 3 months for a physical.  We will see each other twice yearly after this. The patient voiced understanding and agreement to the plan.  Jilda Roche [place], DO 12/03/17  4:35 PM

## 2017-12-03 NOTE — Patient Instructions (Signed)
Stop Belsomra for now. Let me know if things go south after stopping.  Let us know if you need anything.

## 2017-12-21 ENCOUNTER — Encounter: Payer: Self-pay | Admitting: Endocrinology

## 2017-12-21 ENCOUNTER — Ambulatory Visit: Payer: Federal, State, Local not specified - PPO | Admitting: Endocrinology

## 2017-12-21 VITALS — BP 118/80 | HR 74 | Ht 75.0 in | Wt 199.2 lb

## 2017-12-21 DIAGNOSIS — R7989 Other specified abnormal findings of blood chemistry: Secondary | ICD-10-CM | POA: Diagnosis not present

## 2017-12-21 DIAGNOSIS — R5383 Other fatigue: Secondary | ICD-10-CM | POA: Diagnosis not present

## 2017-12-21 LAB — IBC PANEL
IRON: 76 ug/dL (ref 42–165)
Saturation Ratios: 21.5 % (ref 20.0–50.0)
TRANSFERRIN: 252 mg/dL (ref 212.0–360.0)

## 2017-12-21 LAB — CORTISOL: Cortisol, Plasma: 5.3 ug/dL

## 2017-12-21 LAB — TSH: TSH: 1.05 u[IU]/mL (ref 0.35–4.50)

## 2017-12-21 NOTE — Progress Notes (Signed)
Subjective:    Patient ID: Christian MedalChristopher Baldwin, male    DOB: 02/07/1976, 42 y.o.   MRN: [id number]  HPI Pt is referred by Dr Carmelia RollerWendling,  for hypogonadism.  Pt reports he had puberty at the normal age.  He has 2 biological children.  He says he has never taken illicit androgens.  He has never been on any prescribed medication for hypogonadism.  He does not take antiandrogens or opioids.  He denies any h/o infertility, XRT, or genital infection.  He has never had surgery, or a serious injury to the head or genital area. He has no h/o sleep apnea or DVT.   He does not consume alcohol excessively.    He has had a vasectomy.  He has moderate insomnia, and assoc fatigue.   Past Medical History:  Diagnosis Date  . Allergy     Past Surgical History:  Procedure Laterality Date  . HERNIA REPAIR      Social History   Socioeconomic History  . Marital status: Married    Spouse name: Not on file  . Number of children: Not on file  . Years of education: Not on file  . Highest education level: Not on file  Occupational History  . Not on file  Social Needs  . Financial resource strain: Not on file  . Food insecurity:    Worry: Not on file    Inability: Not on file  . Transportation needs:    Medical: Not on file    Non-medical: Not on file  Tobacco Use  . Smoking status: Never Smoker  . Smokeless tobacco: Never Used  Substance and Sexual Activity  . Alcohol use: No  . Drug use: Not on file  . Sexual activity: Not on file  Lifestyle  . Physical activity:    Days per week: Not on file    Minutes per session: Not on file  . Stress: Not on file  Relationships  . Social connections:    Talks on phone: Not on file    Gets together: Not on file    Attends religious service: Not on file    Active member of club or organization: Not on file    Attends meetings of clubs or organizations: Not on file    Relationship status: Not on file  . Intimate partner violence:    Fear of current or  ex partner: Not on file    Emotionally abused: Not on file    Physically abused: Not on file    Forced sexual activity: Not on file  Other Topics Concern  . Not on file  Social History Narrative  . Not on file    Current Outpatient Medications on File Prior to Visit  Medication Sig Dispense Refill  . albuterol (PROVENTIL HFA;VENTOLIN HFA) 108 (90 Base) MCG/ACT inhaler Inhale 1 puff into the lungs every 6 (six) hours as needed for wheezing or shortness of breath. 1 Inhaler 2  . fluticasone (FLONASE) 50 MCG/ACT nasal spray Place 2 sprays into both nostrils daily. 16 g 2  . montelukast (SINGULAIR) 10 MG tablet Take 1 tablet (10 mg total) by mouth at bedtime. 90 tablet 1  . sertraline (ZOLOFT) 50 MG tablet Take 1 tablet (50 mg total) by mouth daily. 30 tablet 3  . sodium chloride HYPERTONIC 3 % nebulizer solution Take by nebulization as needed for other or cough (congestion). 750 mL 12   No current facility-administered medications on file prior to visit.     No  Known Allergies  Family History  Problem Relation Age of Onset  . Cancer Neg Hx   . Other Neg Hx        low testosterone    BP 118/80 (BP Location: Right Arm, Patient Position: Sitting, Cuff Size: Normal)   Pulse 74   Ht 6\' 3"  (1.905 m)   Wt 199 lb 3.2 oz (90.4 kg)   SpO2 95%   BMI 24.90 kg/m     Review of Systems denies depression, numbness, erectile dysfunction, weight change, decreased urinary stream, gynecomastia, muscle weakness, fever, headache, easy bruising, sob, diplopia, and chest pain.  He has rhinorrhea and dry skin.       Objective:   Physical Exam VS: see vs page GEN: no distress HEAD: head: no deformity eyes: no periorbital swelling, no proptosis external nose and ears are normal mouth: no lesion seen NECK: supple, thyroid is not enlarged CHEST WALL: no deformity LUNGS: clear to auscultation BREASTS:  No gynecomastia CV: reg rate and rhythm, no murmur ABD: abdomen is soft, nontender.  no  hepatosplenomegaly.  not distended.  no hernia GENITALIA:  Normal male.   MUSCULOSKELETAL: muscle bulk and strength are grossly normal.  no obvious joint swelling.  gait is normal and steady EXTEMITIES: no deformity.  no ulcer on the feet.  feet are of normal color and temp.  no edema PULSES: dorsalis pedis intact bilat.  no carotid bruit NEURO:  cn 2-12 grossly intact.   readily moves all 4's.  sensation is intact to touch on the feet SKIN:  Normal texture and temperature.  No rash or suspicious lesion is visible.  Normal hair distribution NODES:  None palpable at the neck PSYCH: alert, well-oriented.  Does not appear anxious nor depressed.   Lab Results  Component Value Date   TESTOSTERONE 208 (L) 12/21/2017   Lab Results  Component Value Date   TSH 1.05 12/21/2017   I have reviewed outside records, and summarized: Pt was noted to have low testosterone, and referred here.  Main problem addressed was insomnia      Assessment & Plan:  Low total testosterone, new to me, uncertain etiology  Patient Instructions  blood tests are requested for you today.  We'll let you know about the results. Based on the results, I hope to be able to prescribe for you a pill to increase the testosterone, and to request a repeat of the blood tests in 1 month.   Testosterone treatment has risks, including increased or decreased fertility (depending on the type of treatment), hair loss, prostate cancer, benign prostate enlargement, blood clots, liver problems, lower hdl ("good cholesterol"), polycythemia (opposite of anemia), sleep apnea, and behavior changes.

## 2017-12-21 NOTE — Patient Instructions (Signed)
blood tests are requested for you today.  We'll let you know about the results. Based on the results, I hope to be able to prescribe for you a pill to increase the testosterone, and to request a repeat of the blood tests in 1 month.   Testosterone treatment has risks, including increased or decreased fertility (depending on the type of treatment), hair loss, prostate cancer, benign prostate enlargement, blood clots, liver problems, lower hdl ("good cholesterol"), polycythemia (opposite of anemia), sleep apnea, and behavior changes.

## 2017-12-22 LAB — LUTEINIZING HORMONE: LH: 2.13 m[IU]/mL (ref 1.50–9.30)

## 2017-12-22 LAB — PROLACTIN: Prolactin: 8.4 ng/mL (ref 2.0–18.0)

## 2017-12-23 LAB — TESTOSTERONE,FREE AND TOTAL
Testosterone, Free: 7.6 pg/mL (ref 6.8–21.5)
Testosterone: 208 ng/dL — ABNORMAL LOW (ref 264–916)

## 2018-01-27 ENCOUNTER — Other Ambulatory Visit: Payer: Self-pay | Admitting: Family Medicine

## 2018-01-27 DIAGNOSIS — G4700 Insomnia, unspecified: Secondary | ICD-10-CM

## 2018-03-07 ENCOUNTER — Ambulatory Visit (INDEPENDENT_AMBULATORY_CARE_PROVIDER_SITE_OTHER): Payer: Federal, State, Local not specified - PPO | Admitting: Family Medicine

## 2018-03-07 ENCOUNTER — Encounter: Payer: Self-pay | Admitting: Family Medicine

## 2018-03-07 VITALS — BP 110/78 | HR 70 | Temp 98.5°F | Ht 75.0 in | Wt 201.1 lb

## 2018-03-07 DIAGNOSIS — Z0000 Encounter for general adult medical examination without abnormal findings: Secondary | ICD-10-CM | POA: Diagnosis not present

## 2018-03-07 DIAGNOSIS — G4700 Insomnia, unspecified: Secondary | ICD-10-CM

## 2018-03-07 LAB — COMPREHENSIVE METABOLIC PANEL
ALBUMIN: 4.7 g/dL (ref 3.5–5.2)
ALK PHOS: 91 U/L (ref 39–117)
ALT: 33 U/L (ref 0–53)
AST: 22 U/L (ref 0–37)
BILIRUBIN TOTAL: 0.6 mg/dL (ref 0.2–1.2)
BUN: 13 mg/dL (ref 6–23)
CALCIUM: 9.4 mg/dL (ref 8.4–10.5)
CHLORIDE: 101 meq/L (ref 96–112)
CO2: 30 mEq/L (ref 19–32)
CREATININE: 0.85 mg/dL (ref 0.40–1.50)
GFR: 99.46 mL/min (ref >60.00)
Glucose, Bld: 89 mg/dL (ref 70–99)
Potassium: 4 mEq/L (ref 3.5–5.1)
SODIUM: 139 meq/L (ref 135–145)
TOTAL PROTEIN: 7 g/dL (ref 6.0–8.3)

## 2018-03-07 LAB — LIPID PANEL
CHOLESTEROL: 186 mg/dL (ref 0–200)
HDL: 45.5 mg/dL (ref >39.00)
LDL Cholesterol: 121 mg/dL — ABNORMAL HIGH (ref 0–99)
NonHDL: 140.54
TRIGLYCERIDES: 97 mg/dL (ref 0.0–149.0)
Total CHOL/HDL Ratio: 4
VLDL: 19.4 mg/dL (ref 0.0–40.0)

## 2018-03-07 NOTE — Progress Notes (Signed)
Chief Complaint  Patient presents with  . Annual Exam    Well Male Christian Baldwin is here for a complete physical.   His last physical was >1 year ago.  Current diet: in general, a "healthy" diet.   Current exercise: walking  Weight trend: stable Daytime fatigue? Has been improving. Seat belt? Yes.    Health maintenance Tetanus- Yes HIV- Yes  Past Medical History:  Diagnosis Date  . Allergy      Past Surgical History:  Procedure Laterality Date  . HERNIA REPAIR     Medications  Current Outpatient Medications on File Prior to Visit  Medication Sig Dispense Refill  . albuterol (PROVENTIL HFA;VENTOLIN HFA) 108 (90 Base) MCG/ACT inhaler Inhale 1 puff into the lungs every 6 (six) hours as needed for wheezing or shortness of breath. 1 Inhaler 2  . fluticasone (FLONASE) 50 MCG/ACT nasal spray Place 2 sprays into both nostrils daily. 16 g 2  . montelukast (SINGULAIR) 10 MG tablet Take 1 tablet (10 mg total) by mouth at bedtime. 90 tablet 1  . sertraline (ZOLOFT) 50 MG tablet TAKE 1 TABLET (50 MG TOTAL) BY MOUTH DAILY. TAKE 1/2 TAB DAILY FOR FIRST 2 WEEKS. 90 tablet 1  . sodium chloride HYPERTONIC 3 % nebulizer solution Take by nebulization as needed for other or cough (congestion). 750 mL 12   Allergies No Known Allergies  Family History Family History  Problem Relation Age of Onset  . Cancer Neg Hx   . Other Neg Hx        low testosterone    Review of Systems: Constitutional: no fevers or chills Eye:  no recent significant change in vision Ear/Nose/Mouth/Throat:  Ears:  no tinnitus or hearing loss Nose/Mouth/Throat:  no complaints of nasal congestion, no sore throat Cardiovascular:  no chest pain, no palpitations Respiratory:  no cough and no shortness of breath Gastrointestinal:  no abdominal pain, no change in bowel habits GU:  Male: negative for dysuria, frequency, and incontinence and negative for prostate symptoms Musculoskeletal/Extremities:  no pain,  redness, or swelling of the joints Integumentary (Skin/Breast):  no abnormal skin lesions reported Neurologic:  no headaches, no numbness, tingling Endocrine: No unexpected weight changes Hematologic/Lymphatic:  no night sweats  Exam BP 110/78 (BP Location: Left Arm, Patient Position: Sitting, Cuff Size: Normal)   Pulse 70   Temp 98.5 F (36.9 C) (Oral)   Ht 6\' 3"  (1.905 m)   Wt 201 lb 2 oz (91.2 kg)   SpO2 98%   BMI 25.14 kg/m  General:  well developed, well nourished, in no apparent distress Skin:  no significant moles, warts, or growths Head:  no masses, lesions, or tenderness Eyes:  pupils equal and round, sclera anicteric without injection Ears:  canals without lesions, TMs shiny without retraction, no obvious effusion, no erythema Nose:  nares patent, septum midline, mucosa normal Throat/Pharynx:  lips and gingiva without lesion; tongue and uvula midline; non-inflamed pharynx; no exudates or postnasal drainage Neck: neck supple without adenopathy, thyromegaly, or masses Lungs:  clear to auscultation, breath sounds equal bilaterally, no respiratory distress Cardio:  regular rate and rhythm, no bruits, no LE edema Abdomen:  abdomen soft, nontender; bowel sounds normal; no masses or organomegaly Genital (male): circumcised penis, no lesions or discharge; testes present bilaterally without masses or tenderness Rectal: Deferred Musculoskeletal:  symmetrical muscle groups noted without atrophy or deformity Extremities:  no clubbing, cyanosis, or edema, no deformities, no skin discoloration Neuro:  gait normal; deep tendon reflexes normal and symmetric  Psych: well oriented with normal range of affect and appropriate judgment/insight  Assessment and Plan  Well adult exam - Plan: Comprehensive metabolic panel, Lipid panel  Insomnia, unspecified type - Plan: sertraline (ZOLOFT) 50 MG tablet   Well 43 y.o. male. Counseled on diet and exercise. Counseled on risks and benefits of  prostate cancer screening w PSA. He agrees to forego screening for now. Other orders as above. Follow up in 6 mo pending the above workup. Will see his willingness to come off of Zoloft at that time. The patient voiced understanding and agreement to the plan.  Jilda Roche [place], DO 03/07/18 7:23 AM

## 2018-03-07 NOTE — Patient Instructions (Signed)
Give us 2-3 business days to get the results of your labs back.   Keep the diet clean and stay active.  Let us know if you need anything. 

## 2018-03-07 NOTE — Progress Notes (Signed)
Pre visit review using our clinic review tool, if applicable. No additional management support is needed unless otherwise documented below in the visit note. 

## 2018-04-21 ENCOUNTER — Ambulatory Visit: Payer: Federal, State, Local not specified - PPO | Admitting: Family Medicine

## 2018-04-21 ENCOUNTER — Encounter: Payer: Self-pay | Admitting: Family Medicine

## 2018-04-21 ENCOUNTER — Telehealth: Payer: Self-pay | Admitting: Family Medicine

## 2018-04-21 ENCOUNTER — Other Ambulatory Visit: Payer: Self-pay

## 2018-04-21 VITALS — BP 130/82 | HR 105 | Temp 98.2°F | Ht 75.0 in | Wt 204.0 lb

## 2018-04-21 DIAGNOSIS — R4586 Emotional lability: Secondary | ICD-10-CM | POA: Diagnosis not present

## 2018-04-21 LAB — VITAMIN B12: Vitamin B-12: 976 pg/mL — ABNORMAL HIGH (ref 211–911)

## 2018-04-21 MED ORDER — QUETIAPINE FUMARATE 25 MG PO TABS: 25.0000 mg | ORAL_TABLET | Freq: Every day | ORAL | 2 refills | Status: DC

## 2018-04-21 NOTE — Progress Notes (Signed)
Chief Complaint  Patient presents with  . Follow-up    Subjective: Patient is a 43 y.o. male here for med discussion. Here w brother.  Over the past several weeks, the patient has been experiencing increased euphoria and irresponsible behavior.  He is currently taking Zoloft for presumed anxiety/depression.  He is sleeping well overall.  He has had several family members and friends tell him they are worried about his behavior.  His impulsive spending/purchases have given rise for some of this.  He has been very agitated and irritated lately because he feels trusted people in his life have been lying to him.  He does agree that multiple episodes of irresponsible spending is a problem and that he may need to see a psychiatrist.    ROS: Psych: As above   Past Medical History:  Diagnosis Date  . Allergy     Objective: BP 130/82 (BP Location: Left Arm, Patient Position: Sitting, Cuff Size: Normal)   Pulse (!) 105   Temp 98.2 F (36.8 C) (Oral)   Ht 6\' 3"  (1.905 m)   Wt 204 lb (92.5 kg)   SpO2 98%   BMI 25.50 kg/m  General: Awake, appears stated age Lungs: No accessory muscle use Psych: Reasonable and age-appropriate insight and judgment, speech is more pressured than prior, thoughts tangential at times  Assessment and Plan: Mood changes - Plan: B12, QUEtiapine (SEROQUEL) 25 MG tablet  Orders as above. Stop SSRI. Psych resources given, start today. Strongly suspicious of Bipolar Disorder.  The patient voiced understanding and agreement to the plan.  Greater than 25 minutes were spent face to face with the patient with greater than 50% of this time spent counseling on bipolar disease, mood stabilizers, .    Jilda Roche [place], DO 04/21/18  12:07 PM

## 2018-04-21 NOTE — Patient Instructions (Signed)
[facility] [address] [place], [place] [address] [phone]  [facility] [address] [place], [place] [address] [phone]  [facility] [address] [place], [place] [address] [phone]  [facility] [address], [place], [place], #[phone] [address], [place], [place], #[phone]  [facility] [address] [phone]  [facility] [address] [place], [place] [phone]  [facility] [address] [place], [place] [phone]  Call one of these offices sooner than later as it can take 2-3 months to get a new patient appointment.    

## 2018-04-21 NOTE — Telephone Encounter (Signed)
Spoke with spouse regarding plan. All questions answered.

## 2018-04-26 ENCOUNTER — Emergency Department (HOSPITAL_COMMUNITY)
Admission: EM | Admit: 2018-04-26 | Discharge: 2018-04-26 | Disposition: A | Discharge status: Other Acute Inpatient Hospital | Payer: Federal, State, Local not specified - PPO | Source: Home / Self Care | Attending: Emergency Medicine | Admitting: Emergency Medicine

## 2018-04-26 ENCOUNTER — Other Ambulatory Visit: Payer: Self-pay

## 2018-04-26 ENCOUNTER — Other Ambulatory Visit: Payer: Self-pay | Admitting: Registered Nurse

## 2018-04-26 ENCOUNTER — Inpatient Hospital Stay (HOSPITAL_COMMUNITY)
Admission: AD | Admit: 2018-04-26 | Discharge: 2018-05-02 | DRG: 885 | Disposition: A | Discharge status: Home / Self Care | Payer: Federal, State, Local not specified - PPO | Source: Intra-hospital | Attending: Psychiatry | Admitting: Psychiatry

## 2018-04-26 ENCOUNTER — Encounter (HOSPITAL_COMMUNITY): Payer: Self-pay | Admitting: Emergency Medicine

## 2018-04-26 DIAGNOSIS — Z23 Encounter for immunization: Secondary | ICD-10-CM | POA: Diagnosis not present

## 2018-04-26 DIAGNOSIS — F319 Bipolar disorder, unspecified: Secondary | ICD-10-CM | POA: Diagnosis present

## 2018-04-26 DIAGNOSIS — G4700 Insomnia, unspecified: Secondary | ICD-10-CM | POA: Diagnosis present

## 2018-04-26 DIAGNOSIS — F309 Manic episode, unspecified: Secondary | ICD-10-CM

## 2018-04-26 DIAGNOSIS — Z818 Family history of other mental and behavioral disorders: Secondary | ICD-10-CM | POA: Diagnosis not present

## 2018-04-26 DIAGNOSIS — F3110 Bipolar disorder, current episode manic without psychotic features, unspecified: Secondary | ICD-10-CM | POA: Diagnosis not present

## 2018-04-26 DIAGNOSIS — F3113 Bipolar disorder, current episode manic without psychotic features, severe: Secondary | ICD-10-CM

## 2018-04-26 DIAGNOSIS — F312 Bipolar disorder, current episode manic severe with psychotic features: Principal | ICD-10-CM | POA: Diagnosis present

## 2018-04-26 DIAGNOSIS — Z79899 Other long term (current) drug therapy: Secondary | ICD-10-CM

## 2018-04-26 DIAGNOSIS — F17210 Nicotine dependence, cigarettes, uncomplicated: Secondary | ICD-10-CM

## 2018-04-26 LAB — COMPREHENSIVE METABOLIC PANEL
ALBUMIN: 4.6 g/dL (ref 3.5–5.0)
ALT: 44 U/L (ref 0–44)
AST: 31 U/L (ref 15–41)
Alkaline Phosphatase: 93 U/L (ref 38–126)
Anion gap: 8 (ref 5–15)
BUN: 8 mg/dL (ref 6–20)
CALCIUM: 9.1 mg/dL (ref 8.9–10.3)
CO2: 28 mmol/L (ref 22–32)
Chloride: 105 mmol/L (ref 98–111)
Creatinine, Ser: 0.72 mg/dL (ref 0.61–1.24)
GFR calc Af Amer: >60 mL/min (ref >60)
GFR calc non Af Amer: >60 mL/min (ref >60)
Glucose, Bld: 98 mg/dL (ref 70–99)
Potassium: 3.9 mmol/L (ref 3.5–5.1)
Sodium: 141 mmol/L (ref 135–145)
TOTAL PROTEIN: 7.9 g/dL (ref 6.5–8.1)
Total Bilirubin: 0.6 mg/dL (ref 0.3–1.2)

## 2018-04-26 LAB — RAPID URINE DRUG SCREEN, HOSP PERFORMED
Amphetamines: NOT DETECTED
Barbiturates: NOT DETECTED
Benzodiazepines: NOT DETECTED
Cocaine: NOT DETECTED
Opiates: NOT DETECTED
Tetrahydrocannabinol: NOT DETECTED

## 2018-04-26 LAB — URINALYSIS, ROUTINE W REFLEX MICROSCOPIC
Bilirubin Urine: NEGATIVE
Glucose, UA: NEGATIVE mg/dL
Hgb urine dipstick: NEGATIVE
Ketones, ur: NEGATIVE mg/dL
Leukocytes,Ua: NEGATIVE
Nitrite: NEGATIVE
PROTEIN: NEGATIVE mg/dL
Specific Gravity, Urine: 1.01 (ref 1.005–1.030)
pH: 6 (ref 5.0–8.0)

## 2018-04-26 LAB — CBC WITH DIFFERENTIAL/PLATELET
Abs Immature Granulocytes: 0.03 10*3/uL (ref 0.00–0.07)
Basophils Absolute: 0 10*3/uL (ref 0.0–0.1)
Basophils Relative: 1 %
Eosinophils Absolute: 0.1 10*3/uL (ref 0.0–0.5)
Eosinophils Relative: 1 %
HCT: 50.7 % (ref 39.0–52.0)
HEMOGLOBIN: 16.6 g/dL (ref 13.0–17.0)
Immature Granulocytes: 0 %
Lymphocytes Relative: 16 %
Lymphs Abs: 1.4 10*3/uL (ref 0.7–4.0)
MCH: 31 pg (ref 26.0–34.0)
MCHC: 32.7 g/dL (ref 30.0–36.0)
MCV: 94.6 fL (ref 80.0–100.0)
Monocytes Absolute: 0.7 10*3/uL (ref 0.1–1.0)
Monocytes Relative: 8 %
NEUTROS PCT: 74 %
NRBC: 0 % (ref 0.0–0.2)
Neutro Abs: 6.7 10*3/uL (ref 1.7–7.7)
Platelets: 216 10*3/uL (ref 150–400)
RBC: 5.36 MIL/uL (ref 4.22–5.81)
RDW: 12.6 % (ref 11.5–15.5)
WBC: 8.9 10*3/uL (ref 4.0–10.5)

## 2018-04-26 LAB — ACETAMINOPHEN LEVEL: Acetaminophen (Tylenol), Serum: <10 ug/mL — ABNORMAL LOW (ref 10–30)

## 2018-04-26 LAB — SALICYLATE LEVEL: Salicylate Lvl: <7 mg/dL (ref 2.8–30.0)

## 2018-04-26 LAB — ETHANOL: Alcohol, Ethyl (B): <10 mg/dL (ref <10)

## 2018-04-26 MED ORDER — NICOTINE POLACRILEX 2 MG MT GUM
2.0000 mg | CHEWING_GUM | OROMUCOSAL | Status: DC | PRN
  Administered 2018-04-27 – 2018-05-02 (×10): 2 mg via ORAL
  Filled 2018-04-26 (×5): qty 1

## 2018-04-26 MED ORDER — ACETAMINOPHEN 325 MG PO TABS: 650.0000 mg | ORAL_TABLET | Freq: Four times a day (QID) | ORAL | Status: DC | PRN

## 2018-04-26 MED ORDER — NICOTINE 21 MG/24HR TD PT24
21.0000 mg | MEDICATED_PATCH | Freq: Every day | TRANSDERMAL | Status: DC
  Administered 2018-04-26: 21 mg via TRANSDERMAL
  Filled 2018-04-26: qty 1

## 2018-04-26 MED ORDER — ALBUTEROL SULFATE HFA 108 (90 BASE) MCG/ACT IN AERS
2.0000 | INHALATION_SPRAY | Freq: Four times a day (QID) | RESPIRATORY_TRACT | Status: DC | PRN
  Administered 2018-04-26 – 2018-05-01 (×6): 2 via RESPIRATORY_TRACT
  Filled 2018-04-26 (×2): qty 6.7

## 2018-04-26 MED ORDER — QUETIAPINE FUMARATE 25 MG PO TABS: 25.0000 mg | ORAL_TABLET | Freq: Every day | ORAL | Status: DC

## 2018-04-26 MED ORDER — STERILE WATER FOR INJECTION IJ SOLN
INTRAMUSCULAR | Status: AC
  Filled 2018-04-26: qty 10

## 2018-04-26 MED ORDER — OLANZAPINE 10 MG PO TBDP
10.0000 mg | ORAL_TABLET | Freq: Three times a day (TID) | ORAL | Status: DC | PRN
  Administered 2018-04-26: 10 mg via ORAL
  Filled 2018-04-26: qty 1

## 2018-04-26 MED ORDER — ZIPRASIDONE MESYLATE 20 MG IM SOLR
20.0000 mg | Freq: Once | INTRAMUSCULAR | Status: AC
  Administered 2018-04-26: 20 mg via INTRAMUSCULAR
  Filled 2018-04-26: qty 20

## 2018-04-26 MED ORDER — ONDANSETRON HCL 4 MG PO TABS: 4.0000 mg | ORAL_TABLET | Freq: Three times a day (TID) | ORAL | Status: DC | PRN

## 2018-04-26 MED ORDER — TRAZODONE HCL 50 MG PO TABS
50.0000 mg | ORAL_TABLET | Freq: Every evening | ORAL | Status: DC | PRN
  Administered 2018-04-26: 50 mg via ORAL
  Filled 2018-04-26 (×5): qty 1

## 2018-04-26 MED ORDER — ZIPRASIDONE MESYLATE 20 MG IM SOLR: 20.0000 mg | INTRAMUSCULAR | Status: DC | PRN

## 2018-04-26 MED ORDER — ACETAMINOPHEN 325 MG PO TABS: 650.0000 mg | ORAL_TABLET | ORAL | Status: DC | PRN

## 2018-04-26 MED ORDER — ALUM & MAG HYDROXIDE-SIMETH 200-200-20 MG/5ML PO SUSP: 30.0000 mL | Freq: Four times a day (QID) | ORAL | Status: DC | PRN

## 2018-04-26 MED ORDER — LORAZEPAM 1 MG PO TABS: 1.0000 mg | ORAL_TABLET | ORAL | Status: DC | PRN

## 2018-04-26 NOTE — ED Notes (Signed)
All IVC papers copied after being served.

## 2018-04-26 NOTE — ED Notes (Signed)
Patient wanting to call his wife. Called to see if she wanted to be contacted by the patient, Wife was crying, scared and  Stated she absolutely  could not talk to him or hear his voice.  She is get paper signed to protect her and the children.  Staff can call her with updates, but she does not want to talk to him
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
## 2018-04-26 NOTE — ED Notes (Signed)
Pt called his father, officer remains at the bedside, updating vitals

## 2018-04-26 NOTE — Progress Notes (Signed)
Pt accepted to [facility], Bed 502-1   Christian Rankin, NP is the accepting provider.  Christian Johns, MD is the attending provider.  Call report to [phone]  [facility] ED notified.   Pt is IVC  Pt may be transported by [facility] Pt scheduled  to arrive at [facility] as soon as transport can be arranged.  Christian Baldwin. Christian Baldwin, MSW, LCSWA Disposition Clinical Social Work [phone] (cell) [phone] (office)
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
## 2018-04-26 NOTE — ED Notes (Signed)
Brother Marianne Ow can be reached at [phone]

## 2018-04-26 NOTE — ED Notes (Signed)
Officers changed out, wife still has not came in to file IVC.  Suggested by police department for the EDP to do IVC.

## 2018-04-26 NOTE — ED Notes (Signed)
Pt is calm talking to officer, finished meal, wanting to make a phone call.  Officers calling for transport.
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
## 2018-04-26 NOTE — ED Notes (Signed)
Pt requested a nicotine patch. RN notified.

## 2018-04-26 NOTE — ED Notes (Signed)
Pt has very supportive brothers, call them with any needs.  Kendell Bane [phone] Tawanna Cooler [phone]

## 2018-04-26 NOTE — ED Notes (Signed)
Wife number for staff only for updates [phone] cell [phone]

## 2018-04-26 NOTE — ED Notes (Signed)
Pt yelling at staff, officer remains in the department.

## 2018-04-26 NOTE — ED Notes (Signed)
Pt yelling out, cursing, Statistician at the bedside, EDP aware to orders given.

## 2018-04-26 NOTE — ED Notes (Signed)
Called wife to update, Per wife, please make sure he does not call her parents. They are elderly and can not handle any news. He had called them at some points before coming to the hospital.  Officer here with IVC papers and ready to transport to [facility].

## 2018-04-27 ENCOUNTER — Other Ambulatory Visit: Payer: Self-pay

## 2018-04-27 ENCOUNTER — Encounter (HOSPITAL_COMMUNITY): Payer: Self-pay

## 2018-04-27 DIAGNOSIS — F319 Bipolar disorder, unspecified: Secondary | ICD-10-CM

## 2018-04-27 MED ORDER — OMEGA-3-ACID ETHYL ESTERS 1 G PO CAPS
1.0000 g | ORAL_CAPSULE | Freq: Two times a day (BID) | ORAL | Status: DC
  Administered 2018-04-27 – 2018-05-02 (×10): 1 g via ORAL
  Filled 2018-04-27 (×16): qty 1

## 2018-04-27 MED ORDER — CARBAMAZEPINE 200 MG PO TABS
200.0000 mg | ORAL_TABLET | Freq: Two times a day (BID) | ORAL | Status: DC
  Administered 2018-04-27 – 2018-04-28 (×3): 200 mg via ORAL
  Filled 2018-04-27 (×5): qty 1

## 2018-04-27 MED ORDER — BENZTROPINE MESYLATE 0.5 MG PO TABS
0.5000 mg | ORAL_TABLET | Freq: Two times a day (BID) | ORAL | Status: DC
  Administered 2018-04-27 – 2018-05-02 (×11): 0.5 mg via ORAL
  Filled 2018-04-27 (×17): qty 1

## 2018-04-27 MED ORDER — PNEUMOCOCCAL VAC POLYVALENT 25 MCG/0.5ML IJ INJ
0.5000 mL | INJECTION | INTRAMUSCULAR | Status: AC
  Administered 2018-04-30: 0.5 mL via INTRAMUSCULAR

## 2018-04-27 MED ORDER — TEMAZEPAM 15 MG PO CAPS
30.0000 mg | ORAL_CAPSULE | Freq: Every day | ORAL | Status: DC
  Administered 2018-04-28 – 2018-05-01 (×4): 30 mg via ORAL
  Filled 2018-04-27 (×5): qty 2

## 2018-04-27 MED ORDER — PRENATAL MULTIVITAMIN CH
1.0000 | ORAL_TABLET | Freq: Every day | ORAL | Status: DC
  Administered 2018-04-27 – 2018-05-02 (×6): 1 via ORAL
  Filled 2018-04-27 (×7): qty 1

## 2018-04-27 MED ORDER — RISPERIDONE 3 MG PO TABS
3.0000 mg | ORAL_TABLET | Freq: Two times a day (BID) | ORAL | Status: DC
  Administered 2018-04-27 – 2018-04-28 (×3): 3 mg via ORAL
  Filled 2018-04-27 (×5): qty 1

## 2018-04-27 NOTE — Tx Team (Signed)
Interdisciplinary Treatment and Diagnostic Plan Update  04/27/2018 Time of Session: 0924 Christian Baldwin MRN: [id number]  Principal Diagnosis: <principal problem not specified>  Secondary Diagnoses: Active Problems:   Bipolar 1 disorder (HCC)   Current Medications:  Current Facility-Administered Medications  Medication Dose Route Frequency Provider Last Rate Last Dose  . acetaminophen (TYLENOL) tablet 650 mg  650 mg Oral Q6H PRN Patriciaann Clan E, PA-C      . albuterol (PROVENTIL HFA;VENTOLIN HFA) 108 (90 Base) MCG/ACT inhaler 2 puff  2 puff Inhalation Q6H PRN Patriciaann Clan E, PA-C   2 puff at 04/27/18 0817  . benztropine (COGENTIN) tablet 0.5 mg  0.5 mg Oral BID Johnn Hai, MD      . carbamazepine (TEGRETOL) tablet 200 mg  200 mg Oral BID Johnn Hai, MD      . OLANZapine zydis [facility]) disintegrating tablet 10 mg  10 mg Oral Q8H PRN Patriciaann Clan E, PA-C   10 mg at 04/26/18 2259   And  . LORazepam (ATIVAN) tablet 1 mg  1 mg Oral PRN Patriciaann Clan E, PA-C       And  . ziprasidone (GEODON) injection 20 mg  20 mg Intramuscular PRN Laverle Hobby, PA-C      . nicotine polacrilex (NICORETTE) gum 2 mg  2 mg Oral PRN Johnn Hai, MD   2 mg at 04/27/18 0817  . [START ON 04/28/2018] pneumococcal 23 valent vaccine (PNU-IMMUNE) injection 0.5 mL  0.5 mL Intramuscular Tomorrow-1000 Johnn Hai, MD      . risperiDONE (RISPERDAL) tablet 3 mg  3 mg Oral BID Johnn Hai, MD      . temazepam (RESTORIL) capsule 30 mg  30 mg Oral QHS Johnn Hai, MD       PTA Medications: Medications Prior to Admission  Medication Sig Dispense Refill Last Dose  . albuterol (PROVENTIL HFA;VENTOLIN HFA) 108 (90 Base) MCG/ACT inhaler Inhale 1 puff into the lungs every 6 (six) hours as needed for wheezing or shortness of breath. (Patient taking differently: Inhale 2 puffs into the lungs 2 (two) times daily as needed for wheezing or shortness of breath. ) 1 Inhaler 2 04/26/2018 at Unknown time  .  Aspirin-Salicylamide-Caffeine (BC HEADACHE POWDER PO) Take 1 packet by mouth daily as needed.   04/21/2018  . fluticasone (FLONASE) 50 MCG/ACT nasal spray Place 2 sprays into both nostrils daily. 16 g 2 04/20/2018  . montelukast (SINGULAIR) 10 MG tablet Take 1 tablet (10 mg total) by mouth at bedtime. 90 tablet 1 04/20/2018  . QUEtiapine (SEROQUEL) 25 MG tablet Take 1 tablet (25 mg total) by mouth at bedtime. 30 tablet 2 04/21/2018  . sodium chloride HYPERTONIC 3 % nebulizer solution Take by nebulization as needed for other or cough (congestion). 750 mL 12 >month  . VITAMIN D, CHOLECALCIFEROL, PO Take 1 capsule by mouth daily.    04/19/2018 at Unknown time    Patient Stressors: Marital or family conflict Medication change or noncompliance Occupational concerns  Patient Strengths: Facilities manager fund of knowledge Physical Health Work skills  Treatment Modalities: Medication Management, Group therapy, Case management,  1 to 1 session with clinician, Psychoeducation, Recreational therapy.   Physician Treatment Plan for Primary Diagnosis: <principal problem not specified> Long Term Goal(s): Improvement in symptoms so as ready for discharge Improvement in symptoms so as ready for discharge   Short Term Goals: Ability to demonstrate self-control will improve Compliance with prescribed medications will improve  Medication Management: Evaluate patient's response, side effects, and tolerance of  medication regimen.  Therapeutic Interventions: 1 to 1 sessions, Unit Group sessions and Medication administration.  Evaluation of Outcomes: Not Met  Physician Treatment Plan for Secondary Diagnosis: Active Problems:   Bipolar 1 disorder ([place])  Long Term Goal(s): Improvement in symptoms so as ready for discharge Improvement in symptoms so as ready for discharge   Short Term Goals: Ability to demonstrate self-control will improve Compliance with prescribed medications will improve      Medication Management: Evaluate patient's response, side effects, and tolerance of medication regimen.  Therapeutic Interventions: 1 to 1 sessions, Unit Group sessions and Medication administration.  Evaluation of Outcomes: Not Met   RN Treatment Plan for Primary Diagnosis: <principal problem not specified> Long Term Goal(s): Knowledge of disease and therapeutic regimen to maintain health will improve  Short Term Goals: Ability to identify and develop effective coping behaviors will improve and Compliance with prescribed medications will improve  Medication Management: RN will administer medications as ordered by provider, will assess and evaluate patient's response and provide education to patient for prescribed medication. RN will report any adverse and/or side effects to prescribing provider.  Therapeutic Interventions: 1 on 1 counseling sessions, Psychoeducation, Medication administration, Evaluate responses to treatment, Monitor vital signs and CBGs as ordered, Perform/monitor CIWA, COWS, AIMS and Fall Risk screenings as ordered, Perform wound care treatments as ordered.  Evaluation of Outcomes: Not Met   LCSW Treatment Plan for Primary Diagnosis: <principal problem not specified> Long Term Goal(s): Safe transition to appropriate next level of care at discharge, Engage patient in therapeutic group addressing interpersonal concerns.  Short Term Goals: Engage patient in aftercare planning with referrals and resources, Increase social support and Increase skills for wellness and recovery  Therapeutic Interventions: Assess for all discharge needs, 1 to 1 time with Social worker, Explore available resources and support systems, Assess for adequacy in community support network, Educate family and significant other(s) on suicide prevention, Complete Psychosocial Assessment, Interpersonal group therapy.  Evaluation of Outcomes: Not Met   Progress in Treatment: Attending groups:  No. Participating in groups: No. Taking medication as prescribed: Yes. Toleration medication: Yes. Family/Significant other contact made: No, will contact:  father or brother Patient understands diagnosis: No. Discussing patient identified problems/goals with staff: Yes. Medical problems stabilized or resolved: Yes. Denies suicidal/homicidal ideation: Yes. Issues/concerns per patient self-inventory: No. Other: none  New problem(s) identified: No, Describe:  none  New Short Term/Long Term Goal(s):  Patient Goals:  "calm down, stop racing thoughts"  Discharge Plan or Barriers:   Reason for Continuation of Hospitalization: Mania Medication stabilization  Estimated Length of Stay: 3-5 days.  Attendees: Patient: Christian Baldwin 04/27/2018   Physician: Dr Jake Samples, MD 04/27/2018   Nursing: Elesa Massed, RN 04/27/2018   RN Care Manager: 04/27/2018   Social Worker: Lurline Idol, [place] 04/27/2018   Recreational Therapist:  04/27/2018   Other:  04/27/2018   Other:  04/27/2018   Other: 04/27/2018     Scribe for Treatment Team: Joanne Chars, [place] 04/27/2018 12:49 PM

## 2018-04-27 NOTE — BHH Suicide Risk Assessment (Signed)
[facility] Admission Suicide Risk Assessment   Nursing information obtained from:  Patient Demographic factors:  Male, Caucasian Current Mental Status:  NA Loss Factors:  NA Historical Factors:  Impulsivity Risk Reduction Factors:  Responsible for children under 41 years of age, Employed  Total Time spent with patient: 45 minutes Principal Problem: <principal problem not specified> Diagnosis:  Active Problems:   Bipolar 1 disorder (HCC)  Subjective Data: new onset mania - neg UDS  Continued Clinical Symptoms:  Alcohol Use Disorder Identification Test Final Score (AUDIT): 3 The "Alcohol Use Disorders Identification Test", Guidelines for Use in Primary Care, Second Edition.  World Science writer [facility]). Score between 0-7:  no or low risk or alcohol related problems. Score between 8-15:  moderate risk of alcohol related problems. Score between 16-19:  high risk of alcohol related problems. Score 20 or above:  warrants further diagnostic evaluation for alcohol dependence and treatment.   CLINICAL FACTORS:   Bipolar Disorder:   Mixed State   COGNITIVE FEATURES THAT CONTRIBUTE TO RISK:  Loss of executive function    SUICIDE RISK:   Minimal: No identifiable suicidal ideation.  Patients presenting with no risk factors but with morbid ruminations; may be classified as minimal risk based on the severity of the depressive symptoms  PLAN OF CARE: seek diagnostic clarity  I certify that inpatient services furnished can reasonably be expected to improve the patient's condition.   Malvin Johns, MD 04/27/2018, 10:31 AM

## 2018-04-27 NOTE — Progress Notes (Signed)
Adult Psychoeducational Group Note  Date:  04/27/2018 Time:  8:36 PM  Group Topic/Focus:  Wrap-Up Group:   The focus of this group is to help patients review their daily goal of treatment and discuss progress on daily workbooks.  Participation Level:  Active  Participation Quality:  Appropriate  Affect:  Appropriate  Cognitive:  Appropriate  Insight: Appropriate  Engagement in Group:  Engaged  Modes of Intervention:  Discussion  Additional Comments:   Octavio Manns 04/27/2018, 8:36 PM

## 2018-04-27 NOTE — Progress Notes (Signed)
Christian Baldwin is a 43 year old male being admitted involuntarily to [facility] from [facility].  He came to the ED under IVC due to increasing agitation and pacing his back yard with a gun in his hand.  He was very tangential and disorganized during [facility] assessment.  During [facility] admission, Christian Baldwin reported he doesn't know why he is here.  He stated all this started last Wednesday after an argument with his wife, that she is OCD and influencing their children.  He was tangential, disorganized, labile and irritable during [facility] admission.  He denied carrying a gun around his yard as a problems, "I am allowed to have a gun."  He denied SI/HI or A/V hallucinations.  He was able to complete admission process.  He denied any pain or discomfort and appeared to be in no physical distress.  Oriented him to the unit.  Admission paperwork completed and signed.  Belongings searched and secured in locker # 55, no contraband found.  Skin assessment completed and no skin issues noted.  Q 15 minute checks initiated for safety.  We will continue to monitor the progress towards his goals.

## 2018-04-27 NOTE — Plan of Care (Signed)
  Problem: Coping: Goal: Ability to verbalize frustrations and anger appropriately will improve Outcome: Progressing   D: Pt alert and oriented on the unit. Pt engaging with RN staff and other pts. Pt denies SI/HI, A/VH. Pt was isolative in his room asleep for most of the day then awoke and sat in the dayroom watching television and conversing with other pts. Pt is cooperative. A: Education, support and encouragement provided, q15 minute safety checks remain in effect. Medications administered per MD orders. R: No reactions/side effects to medicine noted. Pt denies any concerns at this time, and verbally contracts for safety. Pt ambulating on the unit with no issues. Pt remains safe on and off the unit.

## 2018-04-27 NOTE — BHH Counselor (Signed)
Adult Comprehensive Assessment  Patient ID: Quintus Greear, male   DOB: 08/08/1975, 43 y.o.   MRN: [id number]  Information Source: Information source: Patient  Current Stressors:  Patient states their primary concerns and needs for treatment are:: better sleep, less anxiety Patient states their goals for this hospitilization and ongoing recovery are:: calm down my racing thoughts Educational / Learning stressors: Pt reports he is stressed out by needing to check his phone all the time.   Employment / Job issues: Pt has a stressful job.   Family Relationships: Pt reports he and his wife have disagreements about division of responsibilities around the house.    Living/Environment/Situation:  Living Arrangements: Spouse/significant other, Children Living conditions (as described by patient or guardian): usually good, some conflict Who else lives in the home?: wife, two children How long has patient lived in current situation?: 11 years What is atmosphere in current home: Comfortable  Family History:  Marital status: Married Number of Years Married: 17 What types of issues is patient dealing with in the relationship?: arguments over cell phones an division of work around the house Are you sexually active?: No What is your sexual orientation?: heterosexual Has your sexual activity been affected by drugs, alcohol, medication, or emotional stress?: na Does patient have children?: Yes How many children?: 2 How is patient's relationship with their children?: daughter 63, son 42.  Great relationships.    Childhood History:  By whom was/is the patient raised?: Both parents Additional childhood history information: Parents remained married until pt was 22.  Pt reports "pretty good" childhood: "8 out of 10" rating.  Description of patient's relationship with caregiver when they were a child: mom: good, dad: real good Patient's description of current relationship with people who raised  him/her: mom: good, dad: good How were you disciplined when you got in trouble as a child/adolescent?: appropriate discipline Does patient have siblings?: Yes Number of Siblings: 2 Description of patient's current relationship with siblings: 2 younger brothers.  "great" relationships. Did patient suffer any verbal/emotional/physical/sexual abuse as a child?: No Did patient suffer from severe childhood neglect?: No Has patient ever been sexually abused/assaulted/raped as an adolescent or adult?: No Was the patient ever a victim of a crime or a disaster?: No Witnessed domestic violence?: No Has patient been effected by domestic violence as an adult?: No  Education:  Highest grade of school patient has completed: BS [employer] Currently a Consulting civil engineer?: No Learning disability?: No  Employment/Work Situation:   Employment situation: Employed Where is patient currently employed?: [employer]-benefits How long has patient been employed?: 12 years Patient's job has been impacted by current illness: No What is the longest time patient has a held a job?: current job Did You Receive Any Psychiatric Treatment/Services While in Equities trader?: No([employer] 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  drugs: pt denies If attempted suicide, did drugs/alcohol play a role in this?: No Alcohol/Substance Abuse Treatment Hx: Denies past history Has alcohol/substance abuse ever caused legal problems?: No  Social Support System:   Patient's Community Support System: Good Describe Community  Support System: brothers, father,  pastor, wife usually Type of faith/religion: Baptist How does patient's faith help to cope with current illness?: Jesus watches over me, everything will be OK  Leisure/Recreation:   Leisure and Hobbies: outdoors, classic vehicles  Strengths/Needs:   What is the patient's perception of their strengths?: planning, dependable/loyal Patient states they can use these personal strengths during their treatment to contribute to their recovery: doing things that will help long term--marriage Patient states these barriers may affect/interfere with their treatment: none Patient states these barriers may affect their return to the community: none Other important information patient would like considered in planning for their treatment: none  Discharge Plan:   Currently receiving community mental health services: Yes (From Whom)(in process with [facility]) Patient states concerns and preferences for aftercare planning are: wants to go to [place] Patient states they will know when they are safe and ready for discharge when: I"m ready to go now Does patient have access to transportation?: Yes Does patient have financial barriers related to discharge medications?: No Will patient be returning to same living situation after discharge?: Yes  Summary/Recommendations:   Summary and Recommendations (to be completed by the evaluator): Pt is 43 year old male from [place]. [facility])  Pt is diagnosed with bipolar disorder and was admitted due to mania.  Recommendations for pt include crisis stabilization, therapeutic milieu, attend and participate in groups, medication management, and development of comprehensive mental wellness plan.,  Lorri Frederick. 04/27/2018

## 2018-04-27 NOTE — Progress Notes (Signed)
Recreation Therapy Notes  Date: 3.18.20 Time: 1000 Location: 500 Hall Dayroom  Group Topic:  Goal Planning  Goal Area(s) Addresses:  Patient will be able to identify at least 3 life goals.  Patient will be able to identify obstacles that may hinder goals.  Patient will be able to identify what they need to achieve goals.   Intervention:  Worksheet, pencils  Activity:  Goal Planning.  Patients were to identify goals they wanted to accomplish in a week, month, year and five years.  Patients were to then identify obstacles they may face, what they need to achieve goals and what they can do tomorrow to work towards goals.  Education: Discharge Planning, Goal Setting  Education Outcome: Acknowledges Education/In Group Clarification Provided/Needs Additional Education  Clinical Observations:  Pt did not attend group.    Alycea Segoviano, LRT/CTRS        Emri Sample A 04/27/2018 11:45 AM 

## 2018-04-27 NOTE — Progress Notes (Signed)
Recreation Therapy Notes  INPATIENT RECREATION THERAPY ASSESSMENT  Patient Details Name: Omarian Sandler MRN: [id number] DOB: Aug 05, 1975 Today's Date: 04/27/2018       Information Obtained From: Patient  Able to Participate in Assessment/Interview: Yes  Patient Presentation: Alert  Reason for Admission (Per Patient): Other (Comments)(Pt stated he was told he was acting bi-polar.)  Patient Stressors: Family, Work, Other (Comment)(Phone)  Coping Skills:   Film/video editor, Journal, TV, Arguments, Exercise, Music, Talk, Prayer, Art, Avoidance, Read, Hot Bath/Shower  Leisure Interests (2+):  Exercise - Walking, Individual - Other (Comment), Sports - Other (Comment)(Cruising; Bicycling)  Frequency of Recreation/Participation: (Walking- Daily; Bicycle- Monthly; Cruising- Weekly)  Awareness of Community Resources:  Yes  Community Resources:  Other (Comment)(Walking trail; Fire pit)  Current Use: Yes  If no, Barriers?:    Expressed Interest in [employer] Information: No  County of Residence:  [place]  Patient Main Form of Transportation: Car  Patient Strengths:  Think things through; Good job/pays well; Budget  Patient Identified Areas of Improvement:  Keep phone on vibrate  Patient Goal for Hospitalization:  "get medications straight, destress"  Current SI (including self-harm):  No  Current HI:  No  Current AVH: No  Staff Intervention Plan: Group Attendance, Collaborate with Interdisciplinary Treatment Team  Consent to Intern Participation: N/A    Caroll Rancher, LRT/CTRS   Lillia Abed, Melayah Skorupski A 04/27/2018, 1:24 PM

## 2018-04-27 NOTE — Tx Team (Signed)
Initial Treatment Plan 04/27/2018 2:09 AM Tommy Medal NAT:[id number]    PATIENT STRESSORS: Marital or family conflict Medication change or noncompliance Occupational concerns   PATIENT STRENGTHS: Financial means General fund of knowledge Physical Health Work skills   PATIENT IDENTIFIED PROBLEMS: Mania/agitation  "Spend more time with my kids"  "Go back home and live my life"                 DISCHARGE CRITERIA:  Improved stabilization in mood, thinking, and/or behavior Need for constant or close observation no longer present Verbal commitment to aftercare and medication compliance  PRELIMINARY DISCHARGE PLAN: Outpatient therapy Medication management  PATIENT/FAMILY INVOLVEMENT: This treatment plan has been presented to and reviewed with the patient, Bashir Kusner.  The patient and family have been given the opportunity to ask questions and make suggestions.  Levin Bacon, RN 04/27/2018, 2:09 AM
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
## 2018-04-28 ENCOUNTER — Other Ambulatory Visit: Payer: Self-pay | Admitting: Family Medicine

## 2018-04-28 MED ORDER — CARBAMAZEPINE 200 MG PO TABS
400.0000 mg | ORAL_TABLET | Freq: Every day | ORAL | Status: DC
  Administered 2018-04-29 – 2018-05-01 (×3): 400 mg via ORAL
  Filled 2018-04-28 (×5): qty 2

## 2018-04-28 MED ORDER — RISPERIDONE 2 MG PO TABS
2.0000 mg | ORAL_TABLET | Freq: Two times a day (BID) | ORAL | Status: DC
  Administered 2018-04-28 – 2018-05-02 (×8): 2 mg via ORAL
  Filled 2018-04-28 (×14): qty 1

## 2018-04-28 MED ORDER — FLUTICASONE PROPIONATE 50 MCG/ACT NA SUSP
1.0000 | Freq: Every day | NASAL | Status: DC
  Administered 2018-04-28 – 2018-05-02 (×5): 1 via NASAL
  Filled 2018-04-28 (×2): qty 16
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
## 2018-04-28 NOTE — Plan of Care (Signed)
  Problem: Activity: Goal: Interest or engagement in activities will improve Outcome: Progressing   Problem: Coping: Goal: Ability to verbalize frustrations and anger appropriately will improve Outcome: Progressing  D: Pt alert and oriented on the unit. Pt engaging with RN staff and other pts and denies SI/HI, A/VH. Pt was pleasant and cooperative on the unit and conversed with other pts in the day room, while playing card games and watching television. Pt also participated during unit groups and activities.   A: Education, support and encouragement provided, q15 minute safety checks remain in effect. Medications administered per MD orders.  R: No reactions/side effects to medicine noted. Pt denies any concerns at this time, and verbally contracts for safety. Pt ambulating on the unit with no issues. Pt remains safe on and off the unit.

## 2018-04-28 NOTE — Progress Notes (Signed)
D:  Moishe was asleep most of the evening.  He reported just feeling tired since "I took that medication before supper."  He denied SI/HI or A/V hallucinations.  Mood remains labile and was tearful when talking about his family.  "They have just abandoned me.  I don't have anyone that can bring me clothing."  He took his hs medication without difficulty but declined to take the Restoril since he felt like he would be able to sleep without it.  He is currently resting with his eyes closed and appears to be asleep.   A:  1:1 with RN for support and encouragement.  Medications as ordered.  Q 15 minute checks maintained for safety.  Encouraged participation in group and unit activities.   R:  Thayer Ohm remains safe on the unit.  We will continue to monitor the progress towards his goals.
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
## 2018-04-28 NOTE — Progress Notes (Signed)
[facility] MD Progress Note  04/28/2018 9:40 AM Christian Baldwin  MRN:  [id number] Subjective:   Patient states the meds are making him a bit sluggish but he feels improvement already he does indeed seem improved as far as the resolution in the majority of his manic symptomatology and showing good insight but he is understanding that we can back down on the medication, that we generally go at higher doses than maintenance levels to treat acute mania, no involuntary movements, no thoughts of harming self or others.  Spoke with his wife with permission No EPS or TD No acute auditory or visual hallucinations showing some insight  Principal Problem: New onset manic symptomatology Diagnosis: Active Problems:   Bipolar 1 disorder (HCC)  Total Time spent with patient: 20 minutes   Past Medical History:  Past Medical History:  Diagnosis Date  . Allergy     Past Surgical History:  Procedure Laterality Date  . HERNIA REPAIR     Family History:  Family History  Problem Relation Age of Onset  . Cancer Neg Hx   . Other Neg Hx        low testosterone   Family Psychiatric  History: none Social History:  Social History   Substance and Sexual Activity  Alcohol Use Yes     Social History   Substance and Sexual Activity  Drug Use Not on file    Social History   Socioeconomic History  . Marital status: Married    Spouse name: Not on file  . Number of children: Not on file  . Years of education: Not on file  . Highest education level: Not on file  Occupational History  . Not on file  Social Needs  . Financial resource strain: Not on file  . Food insecurity:    Worry: Not on file    Inability: Not on file  . Transportation needs:    Medical: Not on file    Non-medical: Not on file  Tobacco Use  . Smoking status: Current Every Day Smoker    Packs/day: 0.50    Types: Cigarettes, E-cigarettes  . Smokeless tobacco: Never Used  Substance and Sexual Activity  . Alcohol use: Yes   . Drug use: Not on file  . Sexual activity: Not on file  Lifestyle  . Physical activity:    Days per week: Not on file    Minutes per session: Not on file  . Stress: Not on file  Relationships  . Social connections:    Talks on phone: Not on file    Gets together: Not on file    Attends religious service: Not on file    Active member of club or organization: Not on file    Attends meetings of clubs or organizations: Not on file    Relationship status: Not on file  Other Topics Concern  . Not on file  Social History Narrative  . Not on file   Additional Social History:                         Sleep: Good  Appetite:  Good  Current Medications: Current Facility-Administered Medications  Medication Dose Route Frequency Provider Last Rate Last Dose  . acetaminophen (TYLENOL) tablet 650 mg  650 mg Oral Q6H PRN Donell Sievert E, PA-C      . albuterol (PROVENTIL HFA;VENTOLIN HFA) 108 (90 Base) MCG/ACT inhaler 2 puff  2 puff Inhalation Q6H PRN Kerry Hough, PA-C  2 puff at 04/27/18 0817  . benztropine (COGENTIN) tablet 0.5 mg  0.5 mg Oral BID Malvin Johns, MD   0.5 mg at 04/28/18 0740  . [START ON 04/29/2018] carbamazepine (TEGRETOL) tablet 400 mg  400 mg Oral QHS Malvin Johns, MD      . OLANZapine zydis [facility]) disintegrating tablet 10 mg  10 mg Oral Q8H PRN Jajuan, Skoog, PA-C   10 mg at 04/26/18 2259   And  . LORazepam (ATIVAN) tablet 1 mg  1 mg Oral PRN Donell Sievert E, PA-C       And  . ziprasidone (GEODON) injection 20 mg  20 mg Intramuscular PRN Kerry Hough, PA-C      . nicotine polacrilex (NICORETTE) gum 2 mg  2 mg Oral PRN Malvin Johns, MD   2 mg at 04/27/18 2143  . omega-3 acid ethyl esters (LOVAZA) capsule 1 g  1 g Oral BID Malvin Johns, MD   1 g at 04/28/18 0740  . pneumococcal 23 valent vaccine (PNU-IMMUNE) injection 0.5 mL  0.5 mL Intramuscular Tomorrow-1000 Malvin Johns, MD      . prenatal multivitamin tablet 1 tablet  1 tablet Oral Q1200  Malvin Johns, MD   1 tablet at 04/27/18 1702  . risperiDONE (RISPERDAL) tablet 2 mg  2 mg Oral BID Malvin Johns, MD      . temazepam (RESTORIL) capsule 30 mg  30 mg Oral QHS Malvin Johns, MD        Lab Results:  Results for orders placed or performed during the hospital encounter of 04/26/18 (from the past 48 hour(s))  Comprehensive metabolic panel     Status: None   Collection Time: 04/26/18 10:21 AM  Result Value Ref Range   Sodium 141 135 - 145 mmol/L   Potassium 3.9 3.5 - 5.1 mmol/L   Chloride 105 98 - 111 mmol/L   CO2 28 22 - 32 mmol/L   Glucose, Bld 98 70 - 99 mg/dL   BUN 8 6 - 20 mg/dL   Creatinine, Ser 6.29 0.61 - 1.24 mg/dL   Calcium 9.1 8.9 - 52.8 mg/dL   Total Protein 7.9 6.5 - 8.1 g/dL   Albumin 4.6 3.5 - 5.0 g/dL   AST 31 15 - 41 U/L   ALT 44 0 - 44 U/L   Alkaline Phosphatase 93 38 - 126 U/L   Total Bilirubin 0.6 0.3 - 1.2 mg/dL   GFR calc non Af Amer >60 >60 mL/min   GFR calc Af Amer >60 >60 mL/min   Anion gap 8 5 - 15    Comment: Performed at [facility], [address], [place], [place] [address]  Ethanol     Status: None   Collection Time: 04/26/18 10:21 AM  Result Value Ref Range   Alcohol, Ethyl (B) <10 <10 mg/dL    Comment: (NOTE) Lowest detectable limit for serum alcohol is 10 mg/dL. For medical purposes only. Performed at [facility], [address], [place], [place] [address]   CBC with Diff     Status: None   Collection Time: 04/26/18 10:21 AM  Result Value Ref Range   WBC 8.9 4.0 - 10.5 K/uL   RBC 5.36 4.22 - 5.81 MIL/uL   Hemoglobin 16.6 13.0 - 17.0 g/dL   HCT 72.5 36.6 - 44.0 %   MCV 94.6 80.0 - 100.0 fL   MCH 31.0 26.0 - 34.0 pg   MCHC 32.7 30.0 - 36.0 g/dL   RDW 34.7 42.5 - 95.6 %  Platelets 216 150 - 400 K/uL   nRBC 0.0 0.0 - 0.2 %   Neutrophils Relative % 74 %   Neutro Abs 6.7 1.7 - 7.7 K/uL   Lymphocytes Relative 16 %   Lymphs Abs 1.4 0.7 - 4.0 K/uL   Monocytes Relative 8 %   Monocytes Absolute 0.7 0.1 - 1.0 K/uL   Eosinophils  Relative 1 %   Eosinophils Absolute 0.1 0.0 - 0.5 K/uL   Basophils Relative 1 %   Basophils Absolute 0.0 0.0 - 0.1 K/uL   Immature Granulocytes 0 %   Abs Immature Granulocytes 0.03 0.00 - 0.07 K/uL    Comment: Performed at [id number], [address] [place], [place] [address]  Salicylate level     Status: None   Collection Time: 04/26/18 10:21 AM  Result Value Ref Range   Salicylate Lvl <7.0 2.8 - 30.0 mg/dL    Comment: Performed at [facility], [address] [place], [place] [address]  Acetaminophen level     Status: Abnormal   Collection Time: 04/26/18 10:21 AM  Result Value Ref Range   Acetaminophen (Tylenol), Serum <10 (L) 10 - 30 ug/mL    Comment: (NOTE) Therapeutic concentrations vary significantly. A range of 10-30 ug/mL  may be an effective concentration for many patients. However, some  are best treated at concentrations outside of this range. Acetaminophen concentrations >150 ug/mL at 4 hours after ingestion  and >50 ug/mL at 12 hours after ingestion are often associated with  toxic reactions. Performed at [facility], [address], [place], [place] [address]     Blood Alcohol level:  Lab Results  Component Value Date   [facility] <10 04/26/2018    Metabolic Disorder Labs: Lab Results  Component Value Date   HGBA1C 5.2 11/03/2017   Lab Results  Component Value Date   PROLACTIN 8.4 12/21/2017   Lab Results  Component Value Date   CHOL 186 03/07/2018   TRIG 97.0 03/07/2018   HDL 45.50 03/07/2018   CHOLHDL 4 03/07/2018   VLDL 19.4 03/07/2018   LDLCALC 121 (H) 03/07/2018   LDLCALC 130 (H) 01/07/2017    Physical Findings: AIMS: Facial and Oral Movements Muscles of Facial Expression: None, normal Lips and Perioral Area: None, normal Jaw: None, normal Tongue: None, normal,Extremity Movements Upper (arms, wrists, hands, fingers): None, normal Lower (legs, knees, ankles, toes): None, normal, Trunk Movements Neck, shoulders, hips: None, normal, Overall  Severity Severity of abnormal movements (highest score from questions above): None, normal Incapacitation due to abnormal movements: None, normal Patient's awareness of abnormal movements (rate only patient's report): No Awareness, Dental Status Current problems with teeth and/or dentures?: No Does patient usually wear dentures?: No  CIWA:    COWS:     Musculoskeletal: Strength & Muscle Tone: within normal limits Gait & Station: normal Patient leans: N/A  Psychiatric Specialty Exam: Physical Exam  ROS  Blood pressure 121/72, pulse 84, temperature 98.8 F (37.1 C), temperature source Oral, resp. rate 18, height 6\' 3"  (1.905 m), weight 91.2 kg.Body mass index is 25.12 kg/m.  General Appearance: Casual  Eye Contact:  Good  Speech:  Clear and Coherent  Volume:  Decreased  Mood:  Dysphoric  Affect:  Appropriate  Thought Process:  Coherent  Orientation:  Full (Time, Place, and Person)  Thought Content:  Tangential  Suicidal Thoughts:  No  Homicidal Thoughts:  No  Memory:  Immediate;   Good  Judgement:  Good  Insight:  Good  Psychomotor Activity:  Normal  Concentration:  Concentration: Good  Recall:  Good  Fund of Knowledge:  Good  Language:  Good  Akathisia:  Negative  Handed:  Right  AIMS (if indicated):     Assets:  Physical Health  ADL's:  Intact  Cognition:  WNL  Sleep:  Number of Hours: 6.5     Treatment Plan Summary: Daily contact with patient to assess and evaluate symptoms and progress in treatment, Medication management and Plan We will reduce the Depakote and Tegretol dosings continue current cognitive therapy discussed neuroimaging with the patient he states he is only had a scan to look at his sinuses before, we will consider neuroimaging again given the lateness of this onset of this illness.  Continue cognitive therapy reality-based therapy current precautions   Tamotsu Wiederholt, MD 04/28/2018, 9:40 AM

## 2018-04-28 NOTE — Progress Notes (Signed)
Recreation Therapy Notes  Date: 3.19.20 Time: 0950 Location: 500 [address] Dayroom   Group Topic: Communication, Team Building, Problem Solving  Goal Area(s) Addresses:  Patient will effectively work with peer towards shared goal.  Patient will identify skill used to make activity successful.  Patient will identify how skills used during activity can be used to reach post d/c goals.   Behavioral Response: Engaged  Intervention: STEM Activity   Activity: [employer]. Patients were provided the following materials: 5 drinking straws, 5 rubber bands, 5 paper clips, 2 index cards, and 2 drinking cups. Using the provided materials patients were asked to build a launching mechanisms to launch a ping pong ball approximately 12 feet. Patients were divided into teams of 3-5.   Education: Pharmacist, community, Building control surveyor.   Education Outcome: Acknowledges education/In group clarification offered/Needs additional education.   Clinical Observations/Feedback: Pt was active and engaged with his peers.  Pt shared to leadership role with his peers as they worked on the activity.  Pt was pleasant and focused on the task at hand.  Pt expressed the skills from the activity could be used outside of the hospital by building new relationships and being there for the people around you.    Caroll Rancher, LRT/CTRS    Caroll Rancher A 04/28/2018 11:16 AM

## 2018-04-29 ENCOUNTER — Inpatient Hospital Stay (HOSPITAL_COMMUNITY): Payer: Federal, State, Local not specified - PPO

## 2018-04-29 MED ORDER — LORAZEPAM 0.5 MG PO TABS
1.5000 mg | ORAL_TABLET | Freq: Four times a day (QID) | ORAL | Status: DC | PRN
  Administered 2018-04-29: 1.5 mg via ORAL
  Filled 2018-04-29: qty 3

## 2018-04-29 NOTE — Progress Notes (Signed)
[facility] MD Progress Note  04/29/2018 8:10 AM Christian Baldwin  MRN:  [id number] Subjective:    Patient did sleep well he believes the medications are appropriate he states since we lowered the dose of Risperdal to a 2 mg strength he feels much better more alert he does not have racing thoughts and he does not have thoughts of harming self or others. He does ramble some but again he is not pressured in speech or demeanor he does not have EPS or TD. We discussed once again neuro imaging given the lateness of his bipolar type presentation and he would be more reassured so we will go ahead and order MRI  Principal Problem: New onset mania in the absence of drug use in the absence of head injury Diagnosis: Active Problems:   Bipolar 1 disorder (HCC)  Total Time spent with patient: 20 minutes  Past Medical History:  Past Medical History:  Diagnosis Date  . Allergy     Past Surgical History:  Procedure Laterality Date  . HERNIA REPAIR     Family History:  Family History  Problem Relation Age of Onset  . Cancer Neg Hx   . Other Neg Hx        low testosterone    Social History:  Social History   Substance and Sexual Activity  Alcohol Use Yes     Social History   Substance and Sexual Activity  Drug Use Not on file    Social History   Socioeconomic History  . Marital status: Married    Spouse name: Not on file  . Number of children: Not on file  . Years of education: Not on file  . Highest education level: Not on file  Occupational History  . Not on file  Social Needs  . Financial resource strain: Not on file  . Food insecurity:    Worry: Not on file    Inability: Not on file  . Transportation needs:    Medical: Not on file    Non-medical: Not on file  Tobacco Use  . Smoking status: Current Every Day Smoker    Packs/day: 0.50    Types: Cigarettes, E-cigarettes  . Smokeless tobacco: Never Used  Substance and Sexual Activity  . Alcohol use: Yes  . Drug use: Not  on file  . Sexual activity: Not on file  Lifestyle  . Physical activity:    Days per week: Not on file    Minutes per session: Not on file  . Stress: Not on file  Relationships  . Social connections:    Talks on phone: Not on file    Gets together: Not on file    Attends religious service: Not on file    Active member of club or organization: Not on file    Attends meetings of clubs or organizations: Not on file    Relationship status: Not on file  Other Topics Concern  . Not on file  Social History Narrative  . Not on file   Sleep: Good  Appetite:  Good  Current Medications: Current Facility-Administered Medications  Medication Dose Route Frequency Provider Last Rate Last Dose  . acetaminophen (TYLENOL) tablet 650 mg  650 mg Oral Q6H PRN Donell Sievert E, PA-C      . albuterol (PROVENTIL HFA;VENTOLIN HFA) 108 (90 Base) MCG/ACT inhaler 2 puff  2 puff Inhalation Q6H PRN Donell Sievert E, PA-C   2 puff at 04/29/18 0533  . benztropine (COGENTIN) tablet 0.5 mg  0.5 mg  Oral BID Malvin Johns, MD   0.5 mg at 04/29/18 0757  . carbamazepine (TEGRETOL) tablet 400 mg  400 mg Oral QHS Malvin Johns, MD      . fluticasone [facility]) 50 MCG/ACT nasal spray 1 spray  1 spray Each Nare Daily Malvin Johns, MD   1 spray at 04/29/18 0757  . OLANZapine zydis (ZYPREXA) disintegrating tablet 10 mg  10 mg Oral Q8H PRN Kamarr, Arts, PA-C   10 mg at 04/26/18 2259   And  . LORazepam (ATIVAN) tablet 1 mg  1 mg Oral PRN Donell Sievert E, PA-C       And  . ziprasidone (GEODON) injection 20 mg  20 mg Intramuscular PRN Kerry Hough, PA-C      . nicotine polacrilex (NICORETTE) gum 2 mg  2 mg Oral PRN Malvin Johns, MD   2 mg at 04/29/18 0534  . omega-3 acid ethyl esters (LOVAZA) capsule 1 g  1 g Oral BID Malvin Johns, MD   1 g at 04/29/18 0757  . pneumococcal 23 valent vaccine (PNU-IMMUNE) injection 0.5 mL  0.5 mL Intramuscular Tomorrow-1000 Malvin Johns, MD      . prenatal multivitamin tablet 1 tablet   1 tablet Oral Q1200 Malvin Johns, MD   1 tablet at 04/28/18 1218  . risperiDONE (RISPERDAL) tablet 2 mg  2 mg Oral BID Malvin Johns, MD   2 mg at 04/29/18 0757  . temazepam (RESTORIL) capsule 30 mg  30 mg Oral QHS Malvin Johns, MD   30 mg at 04/28/18 2122    Lab Results: No results found for this or any previous visit (from the past 48 hour(s)).  Blood Alcohol level:  Lab Results  Component Value Date   ETH <10 04/26/2018    Metabolic Disorder Labs: Lab Results  Component Value Date   HGBA1C 5.2 11/03/2017   Lab Results  Component Value Date   PROLACTIN 8.4 12/21/2017   Lab Results  Component Value Date   CHOL 186 03/07/2018   TRIG 97.0 03/07/2018   HDL 45.50 03/07/2018   CHOLHDL 4 03/07/2018   VLDL 19.4 03/07/2018   LDLCALC 121 (H) 03/07/2018   LDLCALC 130 (H) 01/07/2017    Physical Findings: AIMS: Facial and Oral Movements Muscles of Facial Expression: None, normal Lips and Perioral Area: None, normal Jaw: None, normal Tongue: None, normal,Extremity Movements Upper (arms, wrists, hands, fingers): None, normal Lower (legs, knees, ankles, toes): None, normal, Trunk Movements Neck, shoulders, hips: None, normal, Overall Severity Severity of abnormal movements (highest score from questions above): None, normal Incapacitation due to abnormal movements: None, normal Patient's awareness of abnormal movements (rate only patient's report): No Awareness, Dental Status Current problems with teeth and/or dentures?: No Does patient usually wear dentures?: No  CIWA:    COWS:     Musculoskeletal: Strength & Muscle Tone: within normal limits Gait & Station: normal Patient leans: N/A  Psychiatric Specialty Exam: Physical Exam  ROS  Blood pressure 103/85, pulse 96, temperature 98 F (36.7 C), temperature source Oral, resp. rate 18, height 6\' 3"  (1.905 m), weight 91.2 kg.Body mass index is 25.12 kg/m.  General Appearance: Casual  Eye Contact:  Good  Speech:  Clear and  Coherent  Volume:  Decreased  Mood:  Dysphoric  Affect:  Constricted  Thought Process:  Descriptions of Associations: Tangential  Orientation:  Full (Time, Place, and Person)  Thought Content:  Logical  Suicidal Thoughts:  No  Homicidal Thoughts:  No  Memory:  Recent;  Good  Judgement:  Good  Insight:  Good  Psychomotor Activity:  Normal  Concentration:  Concentration: Good  Recall:  Good  Fund of Knowledge:  Good  Language:  Good  Akathisia:  Negative  Handed:  Right  AIMS (if indicated):     Assets:  Communication Skills Desire for Improvement Financial Resources/Insurance Housing Intimacy Physical Health Resilience Social Support  ADL's:  Intact  Cognition:  WNL  Sleep:  Number of Hours: 4     Treatment Plan Summary: Daily contact with patient to assess and evaluate symptoms and progress in treatment, Medication management and Plan Continue current mood stabilizer/Risperdal therapy continue current precautions probable discharge within 2 to 3 days but check MRI first  Evangelina Delancey, MD 04/29/2018, 8:10 AM

## 2018-04-29 NOTE — Progress Notes (Signed)
D:  Jevon was up and in the day room most of the evening.  He was pleasant and cooperative on initial approach.  He denied SI/HI or A/V hallucinations.  He interacted well with staff and peers.  He denied any pain or discomfort and appeared to be in no physical distress.  He reported feeling tired and groggy from the medications but did take Restoril at bedtime.  He has been up a few times during the night to check the clock.   A:  1:1 with RN for support and encouragement.  Medications as ordered.  Q 15 minute checks maintained for safety.  Encouraged participation in group and unit activities.   R:  Shemar remains safe on the unit.  We will continue to monitor the progress towards his goals.

## 2018-04-29 NOTE — Progress Notes (Signed)
   04/29/18 0534  Sleep  Number of Hours 4   Pt was restless last night; approach NS throughout the night checking the time.

## 2018-04-29 NOTE — Progress Notes (Signed)
Recreation Therapy Notes  Date: 3.20.20 Time: 0955 Location: 500 Hall Dayroom  Group Topic: Wellness  Goal Area(s) Addresses:  Patient will define components of whole wellness. Patient will verbalize benefit of whole wellness.  Behavioral Response: Engaged  Intervention:  Music  Activity: Exercise.  LRT led patients in a series of stretches.  Each patient was then given the opportunity to lead the group in an exercise of their choice.  Patients could take water breaks as needed   Education: Wellness, Building control surveyor.   Education Outcome: Acknowledges education/In group clarification offered/Needs additional education.   Clinical Observations/Feedback: Pt was engaged, social and bright during group.  Patient would make appropriate jokes during activity.  Pt completed exercises to the best of his ability.    Caroll Rancher, LRT/CTRS    Caroll Rancher A 04/29/2018 12:07 PM

## 2018-04-29 NOTE — [facility] Group Notes (Signed)
Date: 04/29/18, 1315  Type of Therapy and Topic: Chaplain group, "Hope is.." Chaplain engaged group in discussion about hope and what it looks like in each patients life and current situation.  Participation level:active  Modes of Intervention: Discussion, Education and Socialization  Summary of Progress/Problems: Pt active during group, very engaged in the group discussion, shared a funny story from church about being rescued by God. Pt had to leave early for MRI but good participation before this.   Lorri Frederick, LCSW   [facility] LCSW Group Therapy Note

## 2018-04-29 NOTE — Plan of Care (Signed)
  Problem: Activity: Goal: Sleeping patterns will improve Outcome: Progressing   Problem: Coping: Goal: Ability to verbalize frustrations and anger appropriately will improve Outcome: Progressing   

## 2018-04-29 NOTE — Progress Notes (Signed)
D Patient is observed OOB UAL on the 500 hall today. He is talkative, rambling , at times, he is knowledgeable about his POC and he asks appropriate questions about his recovery. He is easily distracted, at times and can get tangential.\     A Per his physician, he underwent an MRI of his brain today - without contrast_ and he  returned to this building without difficulty. Received po ativan prior. He completed his daily assessment and on this he wrote he denied SI today and he rated his depression, hopelessness and anxiety " 0/0/1", resssssspectively.     R Safety is in place.

## 2018-04-29 NOTE — Plan of Care (Signed)
  Problem: Education: Goal: Knowledge of [facility] Education information/materials will improve Outcome: Not Progressing Goal: Emotional status will improve Outcome: Not Progressing   

## 2018-04-30 DIAGNOSIS — F3110 Bipolar disorder, current episode manic without psychotic features, unspecified: Secondary | ICD-10-CM

## 2018-04-30 NOTE — Progress Notes (Signed)
   04/30/18 0500  Sleep  Number of Hours 2.75

## 2018-04-30 NOTE — Progress Notes (Addendum)
[facility] MD Progress Note  04/30/2018 6:42 PM Christian Baldwin  MRN:  [id number] Subjective: This is my first time here, and I think is been exaggerated.  I should be able to go home now.  No one has discussed my MRI results.  Objective: 43 year old male who presented to the hospital with racing thoughts, mania, risky behavior, insomnia.  Patient was seen and case discussed with treatment team.  Patient is alert and oriented, irritable and grudgingly cooperative.  Patient is observed ambulating on the unit, and at times appears to be pacing.  He continues to ruminate about discharge, and MRI results.  After discussing results with patient of normal findings, he proceeded to ruminate about not knowing results with nursing staff.  He continues to be delusional at times, however is also able to engage in normal conversation with normal thought processes.  Patient reports sleeping well at this time reporting 6-1/2 hours of sleep.  However upon chart review nursing staff have documented less than 3 hours of sleep.  Nursing staff also reports patient becoming irritable when speaking with significant other.  Despite ongoing mania he is able to offer some additional insight regarding his admission and appears to be stable at this time.  He denies any eating disturbances.  He denies any suicidal ideations, homicidal ideations, and or auditory visual hallucinations.  He is able to contract for safety while on the unit.   Principal Problem: New onset manic symptomatology Diagnosis: Active Problems:   Bipolar 1 disorder (HCC)  Total Time spent with patient: 20 minutes   Past Medical History:  Past Medical History:  Diagnosis Date  . Allergy     Past Surgical History:  Procedure Laterality Date  . HERNIA REPAIR     Family History:  Family History  Problem Relation Age of Onset  . Cancer Neg Hx   . Other Neg Hx        low testosterone   Family Psychiatric  History: none Social History:  Social  History   Substance and Sexual Activity  Alcohol Use Yes     Social History   Substance and Sexual Activity  Drug Use Not on file    Social History   Socioeconomic History  . Marital status: Married    Spouse name: Not on file  . Number of children: Not on file  . Years of education: Not on file  . Highest education level: Not on file  Occupational History  . Not on file  Social Needs  . Financial resource strain: Not on file  . Food insecurity:    Worry: Not on file    Inability: Not on file  . Transportation needs:    Medical: Not on file    Non-medical: Not on file  Tobacco Use  . Smoking status: Current Every Day Smoker    Packs/day: 0.50    Types: Cigarettes, E-cigarettes  . Smokeless tobacco: Never Used  Substance and Sexual Activity  . Alcohol use: Yes  . Drug use: Not on file  . Sexual activity: Not on file  Lifestyle  . Physical activity:    Days per week: Not on file    Minutes per session: Not on file  . Stress: Not on file  Relationships  . Social connections:    Talks on phone: Not on file    Gets together: Not on file    Attends religious service: Not on file    Active member of club or organization: Not on file  Attends meetings of clubs or organizations: Not on file    Relationship status: Not on file  Other Topics Concern  . Not on file  Social History Narrative  . Not on file   Additional Social History:   Sleep: Good  Appetite:  Good  Current Medications: Current Facility-Administered Medications  Medication Dose Route Frequency Provider Last Rate Last Dose  . acetaminophen (TYLENOL) tablet 650 mg  650 mg Oral Q6H PRN Donell Sievert E, PA-C      . albuterol (PROVENTIL HFA;VENTOLIN HFA) 108 (90 Base) MCG/ACT inhaler 2 puff  2 puff Inhalation Q6H PRN Donell Sievert E, PA-C   2 puff at 04/30/18 0905  . benztropine (COGENTIN) tablet 0.5 mg  0.5 mg Oral BID Malvin Johns, MD   0.5 mg at 04/30/18 1610  . carbamazepine (TEGRETOL) tablet  400 mg  400 mg Oral QHS Malvin Johns, MD   400 mg at 04/29/18 2209  . fluticasone (FLONASE) 50 MCG/ACT nasal spray 1 spray  1 spray Each Nare Daily Malvin Johns, MD   1 spray at 04/30/18 [phone]  . OLANZapine zydis (ZYPREXA) disintegrating tablet 10 mg  10 mg Oral Q8H PRN Meade, Edd, PA-C   10 mg at 04/26/18 2259   And  . LORazepam (ATIVAN) tablet 1 mg  1 mg Oral PRN Donell Sievert E, PA-C       And  . ziprasidone (GEODON) injection 20 mg  20 mg Intramuscular PRN Kerry Hough, PA-C      . LORazepam (ATIVAN) tablet 1.5 mg  1.5 mg Oral Q6H PRN Malvin Johns, MD   1.5 mg at 04/29/18 1354  . nicotine polacrilex (NICORETTE) gum 2 mg  2 mg Oral PRN Malvin Johns, MD   2 mg at 04/30/18 0906  . omega-3 acid ethyl esters (LOVAZA) capsule 1 g  1 g Oral BID Malvin Johns, MD   1 g at 04/30/18 1610  . prenatal multivitamin tablet 1 tablet  1 tablet Oral Q1200 Malvin Johns, MD   1 tablet at 04/30/18 1146  . risperiDONE (RISPERDAL) tablet 2 mg  2 mg Oral BID Malvin Johns, MD   2 mg at 04/30/18 1610  . temazepam (RESTORIL) capsule 30 mg  30 mg Oral QHS Malvin Johns, MD   30 mg at 04/29/18 2209    Lab Results:  No results found for this or any previous visit (from the past 48 hour(s)).  Blood Alcohol level:  Lab Results  Component Value Date   ETH <10 04/26/2018    Metabolic Disorder Labs: Lab Results  Component Value Date   HGBA1C 5.2 11/03/2017   Lab Results  Component Value Date   PROLACTIN 8.4 12/21/2017   Lab Results  Component Value Date   CHOL 186 03/07/2018   TRIG 97.0 03/07/2018   HDL 45.50 03/07/2018   CHOLHDL 4 03/07/2018   VLDL 19.4 03/07/2018   LDLCALC 121 (H) 03/07/2018   LDLCALC 130 (H) 01/07/2017    Physical Findings: AIMS: Facial and Oral Movements Muscles of Facial Expression: None, normal Lips and Perioral Area: None, normal Jaw: None, normal Tongue: None, normal,Extremity Movements Upper (arms, wrists, hands, fingers): None, normal Lower (legs, knees,  ankles, toes): None, normal, Trunk Movements Neck, shoulders, hips: None, normal, Overall Severity Severity of abnormal movements (highest score from questions above): None, normal Incapacitation due to abnormal movements: None, normal Patient's awareness of abnormal movements (rate only patient's report): No Awareness, Dental Status Current problems with teeth and/or dentures?: No Does patient usually  wear dentures?: No  CIWA:    COWS:     Musculoskeletal: Strength & Muscle Tone: within normal limits Gait & Station: normal Patient leans: N/A  Psychiatric Specialty Exam: Physical Exam   ROS   Blood pressure (!) 118/96, pulse 87, temperature (!) 97.2 F (36.2 C), temperature source Oral, resp. rate 18, height 6\' 3"  (1.905 m), weight 91.2 kg.Body mass index is 25.12 kg/m.  General Appearance: Casual  Eye Contact:  Good  Speech:  Clear and Coherent  Volume:  Decreased  Mood:  Dysphoric  Affect:  Appropriate  Thought Process:  Coherent  Orientation:  Full (Time, Place, and Person)  Thought Content:  Tangential  Suicidal Thoughts:  No  Homicidal Thoughts:  No  Memory:  Immediate;   Good  Judgement:  Good  Insight:  Good  Psychomotor Activity:  Normal  Concentration:  Concentration: Good  Recall:  Good  Fund of Knowledge:  Good  Language:  Good  Akathisia:  Negative  Handed:  Right  AIMS (if indicated):     Assets:  Physical Health  ADL's:  Intact  Cognition:  WNL  Sleep:  Number of Hours: 2.75     Treatment Plan Summary: Daily contact with patient to assess and evaluate symptoms and progress in treatment, Medication management and Plan We will reduce the Depakote and Tegretol dosings continue current cognitive therapy discussed neuroimaging with the patient he states he is only had a scan to look at his sinuses before, we will consider neuroimaging again given the lateness of this onset of this illness.  Continue cognitive therapy reality-based therapy current  precautions   Maryagnes Amos, FNP 04/30/2018, 6:42 PM   Agree with NP note

## 2018-04-30 NOTE — Plan of Care (Signed)
  Problem: Education: Goal: Knowledge of [facility] Education information/materials will improve Outcome: Progressing Goal: Emotional status will improve Outcome: Progressing   

## 2018-04-30 NOTE — Progress Notes (Signed)
Patient has been observed up in the dayroom watching tv. Writer spoke with him 1:1 and he is concerned with when he is going to be discharged. He rpeorts that he had an MRI today and if everything is fine then he wants to know what is the hold up with his discharge. Writer encouraged to speak with doctor on tomorrow concerning this matter. He was compliant with scheduled medications. Safety maintained on unit with 15 min checks.

## 2018-04-30 NOTE — Progress Notes (Signed)
D Pt is observed OOB UAL on the [address]  Hall. He is neatly dressed. He is appropriate in demeanor some of the time..he has epriods of mood lability where he gets fixated on something  ( ie he wants more than one speay of flonase in his nostril at a time, he wants to go outside today despite high pollen content, he is adamant to speak with the MD about going home today, despite several nurses that have told him he is not slated for discharge today. When he gets agitated at these times, he can be loud, hypomani where he rants loudly and raves for several minutes and then he begins to regain some control and he begins to modulate his behavior.     A He takes his scheduled meds as planned. He completed his daily assessment and on this he wrote he denied having SI today and he rated his depression, hopelessness and anxiety " 0/0/1", respectively.     R Safety in place and therapeutic relationship is fostered.

## 2018-05-01 NOTE — Progress Notes (Signed)
Writer has observed patient up in the dayroom watching tv. Writer spoke with him 1:1 and he reported being disappointed that he was not discharged on today. Writer explained to him the process for discharge and how other doctors fill in over the weekend until his assigned doctor returns at the beginning of the week. He seemed understanding and reports that he will just see what happens. He was compliant with medications. Support given and safety maintained on unit with 15 min checks.

## 2018-05-01 NOTE — Plan of Care (Signed)
  Problem: Activity: Goal: Interest or engagement in activities will improve Outcome: Progressing   Problem: Safety: Goal: Periods of time without injury will increase Outcome: Progressing  DAR NOTE: Patient presents with calm affect and pleasant mood.  Denies suicidal thoughts, pain, auditory and visual hallucinations.  Described energy level as normal and concentration as good.  Rates depression at 0, hopelessness at 0, and anxiety at 1.  Maintained on routine safety checks.  Medications given as prescribed.  Support and encouragement offered as needed.  Attended group and participated.  States goal for today is "work on discharge plan."  Patient observed socializing with peers in the dayroom.  Offered no complaint.
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
## 2018-05-01 NOTE — BHH Group Notes (Signed)
[facility] LCSW Group Therapy Note  Date/Time:  05/01/2018  11:00AM-12:00PM  Type of Therapy and Topic:  Group Therapy:  Music and Mood  Participation Level:  Active   Description of Group: In this process group, members listened to a variety of genres of music and identified that different types of music evoke different responses.  Patients were encouraged to identify music that was soothing for them and music that was energizing for them.  Patients discussed how this knowledge can help with wellness and recovery in various ways including managing depression and anxiety as well as encouraging healthy sleep habits.    Therapeutic Goals: 1. Patients will explore the impact of different varieties of music on mood 2. Patients will verbalize the thoughts they have when listening to different types of music 3. Patients will identify music that is soothing to them as well as music that is energizing to them 4. Patients will discuss how to use this knowledge to assist in maintaining wellness and recovery 5. Patients will explore the use of music as a coping skill  Summary of Patient Progress:  The patient was active in group he expressed that he enjoyed the wide variety of songs that were played and recognizes that music is a valuable and effective coping strategy.  Therapeutic Modalities: Solution Focused Brief Therapy Activity   Henrene Dodge, LCSW

## 2018-05-01 NOTE — Progress Notes (Signed)
Patient has been up and active on the unit, attended group this evening and has voiced no complaints. He is hopeful to discharge on tomorrow.Patient currently denies having pain, -si/hi/a/v hall. Support and encouragement offered, safety maintained on unit, will continue to monitor.  

## 2018-05-02 ENCOUNTER — Other Ambulatory Visit: Payer: Self-pay | Admitting: Family Medicine

## 2018-05-02 DIAGNOSIS — F17210 Nicotine dependence, cigarettes, uncomplicated: Secondary | ICD-10-CM

## 2018-05-02 MED ORDER — RISPERIDONE 4 MG PO TABS: 4.0000 mg | ORAL_TABLET | Freq: Every day | ORAL | 2 refills | Status: DC

## 2018-05-02 MED ORDER — BENZTROPINE MESYLATE 0.5 MG PO TABS: 0.5000 mg | ORAL_TABLET | Freq: Two times a day (BID) | ORAL | 2 refills | Status: DC

## 2018-05-02 MED ORDER — TEMAZEPAM 30 MG PO CAPS: 30.0000 mg | ORAL_CAPSULE | Freq: Every day | ORAL | 0 refills | Status: DC

## 2018-05-02 MED ORDER — OMEGA-3-ACID ETHYL ESTERS 1 G PO CAPS: 1.0000 g | ORAL_CAPSULE | Freq: Two times a day (BID) | ORAL | 5 refills | Status: AC

## 2018-05-02 MED ORDER — CARBAMAZEPINE 200 MG PO TABS: 400.0000 mg | ORAL_TABLET | Freq: Every day | ORAL | 1 refills | Status: DC

## 2018-05-02 NOTE — BHH Suicide Risk Assessment (Signed)
[facility] Discharge Suicide Risk Assessment   Principal Problem: New onset bipolar symptomatology in the absence of drug use and family history/normal MRI Discharge Diagnoses: Active Problems:   Bipolar 1 disorder (HCC)   Total Time spent with patient: 45 minutes  Alert oriented to person place situation time seems euthymic probably baseline and requesting discharge no thoughts of harming self or others contracting fully  Mental Status Per Nursing Assessment::   On Admission:  NA  Demographic Factors:  Male, Divorced or widowed and Caucasian (separated)  Loss Factors: Legal issues  Historical Factors: NA  Risk Reduction Factors:   Sense of responsibility to family, Religious beliefs about death and Employed  Continued Clinical Symptoms:  Bipolar Disorder:   Mixed State  Cognitive Features That Contribute To Risk:  None    Suicide Risk:  Minimal: No identifiable suicidal ideation.  Patients presenting with no risk factors but with morbid ruminations; may be classified as minimal risk based on the severity of the depressive symptoms  Follow-up Information    [facility] Follow up on 05/05/2018.   Why:  Please attend your pharmacologist appointment with Dr. Vivien Presto is Thursday, 3/26 at 9:30a.  Medication management appointment with Dr. Mont Dutton is Thursday, 4/9 at 12:30p.  Be sure to bring your photo ID, proof of insurance, SSN and current medications.  Contact information: [address] [facility] [place] [address] [phone]           Plan Of Care/Follow-up recommendations:  Activity:  full  Loukisha Gunnerson, MD 05/02/2018, 9:41 AM

## 2018-05-02 NOTE — Progress Notes (Signed)
Recreation Therapy Notes  Date: 3.23.20 Time: 1000 Location: 500 Hall Dayroom  Group Topic: Coping Skills  Goal Area(s) Addresses:  Patient will identify positive and negative coping skills. Patient will identify benefits of positive coping skills. Patient will identify benefit of using coping skills post d/c.  Intervention: Worksheet, pencils  Activity: Mind Map.  Patients and LRT filled out the first 8 (anger, love, loneliness, forgiveness, frustration, anxiety, not being validated and racism/discrimination) boxes with triggers/situations that would require coping skills.  Patients were to then identify negative coping skills for each trigger and contrast that to the positive coping skills they identify.  Education: Pharmacologist, Building control surveyor.   Education Outcome: Acknowledges understanding/In group clarification offered/Needs additional education.   Clinical Observations/Feedback: Pt did not attend group.    Caroll Rancher, LRT/CTRS         Caroll Rancher A 05/02/2018 12:01 PM

## 2018-05-02 NOTE — Plan of Care (Signed)
Patient was able to identify some coping skills at completion of recreation therapy group session.   Caroll Rancher, LRT/CTRS
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
## 2018-05-02 NOTE — Plan of Care (Addendum)
Patient self inventory- patient slept well last night, sleep medication was not requested. Appetite is good, concentration good, energy level normal. Depression, hopelessness, and anxiety rated 0, 0, 1 out of 10. Goal is: discharge plan and fulfill.  Patient is compliant with medications; no side effects noted. Safety is maintained with 15 minute checks as well as environmental checks. Will continue to monitor and assess.  Problem: Education: Goal: Emotional status will improve Outcome: Adequate for Discharge   Problem: Education: Goal: Mental status will improve Outcome: Adequate for Discharge   Problem: Education: Goal: Verbalization of understanding the information provided will improve Outcome: Adequate for Discharge   Problem: Activity: Goal: Interest or engagement in activities will improve Outcome: Adequate for Discharge

## 2018-05-02 NOTE — Progress Notes (Signed)
  [facility] Adult Case Management Discharge Plan :  Will you be returning to the same living situation after discharge:  No. Will be staying with father.  At discharge, do you have transportation home?: Yes,  father Do you have the ability to pay for your medications: Yes,  BCBS  Release of information consent forms completed and in the chart;  Patient's signature needed at discharge.  Patient to Follow up at: Follow-up Information    [facility] Follow up on 05/05/2018.   Why:  Please attend your pharmacologist appointment with Dr. Vivien Presto is Thursday, 3/26 at 9:30a.  Medication management appointment with Dr. Mont Dutton is Thursday, 4/9 at 12:30p.  Be sure to bring your photo ID, proof of insurance, SSN and current medications.  Contact information: [address] [facility] [place] [address] [phone]           Next level of care provider has access to [facility] Link:no  Safety Planning and Suicide Prevention discussed: Yes,  with wife  Have you used any form of tobacco in the last 30 days? (Cigarettes, Smokeless Tobacco, Cigars, and/or Pipes): Yes  Has patient been referred to the Quitline?: Patient refused referral  Patient has been referred for addiction treatment: N/A  Lorri Frederick, LCSW 05/02/2018, 10:37 AM

## 2018-05-02 NOTE — Progress Notes (Signed)
Discharge note: Patient reviewed discharge paperwork with RN including prescriptions, follow up appointments, and lab work. Patient given the opportunity to ask questions. All concerns were addressed. All belongings were returned to patient. Denied SI/HI/AVH. Patient thanked staff for their care while at the hospital.  Patient was discharged to lobby where his father was picking him up.

## 2018-05-02 NOTE — Progress Notes (Signed)
Recreation Therapy Notes  INPATIENT RECREATION TR PLAN  Patient Details Name: Christian Baldwin MRN: [id number] DOB: [birth year] Today's Date: 05/02/2018  Rec Therapy Plan Is patient appropriate for Therapeutic Recreation?: Yes Treatment times per week: about 3 days Estimated Length of Stay: 5-7 days TR Treatment/Interventions: Group participation (Comment)  Discharge Criteria Pt will be discharged from therapy if:: Discharged Treatment plan/goals/alternatives discussed and agreed upon by:: Patient/family  Discharge Summary Short term goals set: See patient care plan Short term goals met: Adequate for discharge Progress toward goals comments: Groups attended Which groups?: Wellness, Other (Comment)(Team building) Reason goals not met: None Therapeutic equipment acquired: N/A Reason patient discharged from therapy: Discharge from hospital Pt/family agrees with progress & goals achieved: Yes Date patient discharged from therapy: 05/02/18    Victorino Sparrow, LRT/CTRS  Ria Comment, Leelynn Whetsel A 05/02/2018, 12:09 PM

## 2018-05-27 ENCOUNTER — Encounter: Payer: Self-pay | Admitting: Family Medicine

## 2018-05-27 ENCOUNTER — Ambulatory Visit (INDEPENDENT_AMBULATORY_CARE_PROVIDER_SITE_OTHER): Payer: Federal, State, Local not specified - PPO | Admitting: Family Medicine

## 2018-05-27 ENCOUNTER — Other Ambulatory Visit: Payer: Self-pay

## 2018-05-27 DIAGNOSIS — F319 Bipolar disorder, unspecified: Secondary | ICD-10-CM

## 2018-05-27 NOTE — Progress Notes (Signed)
Chief Complaint  Patient presents with  . Follow-up    Subjective: Patient is a 43 y.o. male here for med f/u. Due to outbreak, we are interacting via web portal for an electronic face-to-face visit. I verified patient's ID using 2 identifiers.   Had some mood changes. We switched Zoloft to Seroquel for concern of mania. Went to ED due to risky and dangerous behavior. Admitted, psych changed his meds. Currently on Cogentin 0.5 mg bid, Tegretol 400 mg qhs, temazepam 30 mg qhs and Risperdal 4 mg qhs. Feels he is doing well. Meds make him tired, doing well otherwise. Feels that family members are also taking note of his improvement.   ROS: Psych: As noted in HPI  Past Medical History:  Diagnosis Date  . Allergy     Objective: No conversational dyspnea Age appropriate judgment and insight Nml affect and mood  Assessment and Plan: Bipolar 1 disorder (HCC)  Cont meds. Will write letter stating he is under my care and has been compliant with recs.  F/u in 6 mo for CPE or prn.  The patient voiced understanding and agreement to the plan.  Jilda Roche Stuckey, DO 05/27/18  1:12 PM

## 2018-06-03 ENCOUNTER — Telehealth: Payer: Self-pay

## 2018-06-03 NOTE — Telephone Encounter (Signed)
Copied from CRM [id number]. Topic: General - Other >> Jun 03, 2018  1:06 PM Percival Spanish wrote: [phone]  Pt wife call to say the wrong tax id number for Dr Carmelia Roller was submitted to [facility] for labs that was done on 10.9.2019. Insurance want pay because it is showing Dr Carmelia Roller out of [place] based on the information that they received from [facility].  illing told them it was an inter office issue and not a issue they can solve wpuld like a call back

## 2018-07-09 ENCOUNTER — Other Ambulatory Visit: Payer: Self-pay | Admitting: Family Medicine

## 2018-08-18 ENCOUNTER — Encounter: Payer: Self-pay | Admitting: Family Medicine

## 2018-08-19 ENCOUNTER — Other Ambulatory Visit: Payer: Self-pay | Admitting: Family Medicine

## 2018-08-19 DIAGNOSIS — F319 Bipolar disorder, unspecified: Secondary | ICD-10-CM

## 2018-09-05 ENCOUNTER — Ambulatory Visit (INDEPENDENT_AMBULATORY_CARE_PROVIDER_SITE_OTHER): Payer: Federal, State, Local not specified - PPO | Admitting: Professional

## 2018-09-05 DIAGNOSIS — F319 Bipolar disorder, unspecified: Secondary | ICD-10-CM

## 2018-09-06 ENCOUNTER — Encounter: Payer: Self-pay | Admitting: Family Medicine

## 2018-09-06 MED ORDER — TEMAZEPAM 30 MG PO CAPS: 30.0000 mg | ORAL_CAPSULE | Freq: Every day | ORAL | 2 refills | Status: DC

## 2018-09-15 ENCOUNTER — Ambulatory Visit: Payer: Federal, State, Local not specified - PPO | Admitting: Professional

## 2018-10-03 ENCOUNTER — Encounter: Payer: Self-pay | Admitting: Adult Health

## 2018-10-03 ENCOUNTER — Other Ambulatory Visit: Payer: Self-pay

## 2018-10-03 ENCOUNTER — Ambulatory Visit (INDEPENDENT_AMBULATORY_CARE_PROVIDER_SITE_OTHER): Payer: Federal, State, Local not specified - PPO | Admitting: Adult Health

## 2018-10-03 DIAGNOSIS — Z79899 Other long term (current) drug therapy: Secondary | ICD-10-CM

## 2018-10-03 DIAGNOSIS — F4310 Post-traumatic stress disorder, unspecified: Secondary | ICD-10-CM | POA: Diagnosis not present

## 2018-10-03 DIAGNOSIS — F319 Bipolar disorder, unspecified: Secondary | ICD-10-CM

## 2018-10-03 DIAGNOSIS — F411 Generalized anxiety disorder: Secondary | ICD-10-CM

## 2018-10-03 MED ORDER — BENZTROPINE MESYLATE 0.5 MG PO TABS: 0.5000 mg | ORAL_TABLET | Freq: Two times a day (BID) | ORAL | 2 refills | Status: DC

## 2018-10-03 MED ORDER — CARBAMAZEPINE 200 MG PO TABS: 400.0000 mg | ORAL_TABLET | Freq: Every day | ORAL | 2 refills | Status: DC

## 2018-10-03 MED ORDER — RISPERIDONE 4 MG PO TABS: 4.0000 mg | ORAL_TABLET | Freq: Every day | ORAL | 2 refills | Status: DC

## 2018-10-03 NOTE — Progress Notes (Signed)
[facility] MD/PA/NP Initial Note  10/03/2018 3:41 PM Christian Baldwin  MRN:  [id number]  Chief Complaint:  Chief Complaint    Manic Behavior; Anxiety      HPI:   Visit Diagnosis:    ICD-10-CM   1. Bipolar I disorder (HCC)  F31.9 carbamazepine (TEGRETOL) 200 MG tablet    risperidone (RISPERDAL) 4 MG tablet    benztropine (COGENTIN) 0.5 MG tablet  2. Generalized anxiety disorder  F41.1 risperidone (RISPERDAL) 4 MG tablet  3. PTSD (post-traumatic stress disorder)  F43.10 risperidone (RISPERDAL) 4 MG tablet  4. High risk medication use  Z79.899 Carbamazepine level, total    benztropine (COGENTIN) 0.5 MG tablet    Wife in attendence  Describes mood today as "ok". Mood symptoms - reports feeling anxious and depressed. Denies irritability. Stating "I think anxiety makes me depressed". Denies irritability. Worries all the time about everything. Has a military mind of having everything laid out. Will over think things. Always problem solving. Feels like the anxiety makes him "depressed". Decreased interest and motivation. Taking medications as prescribed. Started having issues sleeping in October 2019 - had issues over that summer and it got worse. Saw PCP and was started on Zoloft 25mg . Started declining over next months and was hospitalized for mania in March of 2020. Stating that's when everything blew up". Was hospitalized and stabilized on medications. Has been on the same medications since March. [facility]. He is not eligible to return based on qualifications.  Energy levels decreased. Active, does not have a regular exercise routine. Works 40 hours a week. Enjoys some usual interests and activities. Spending time with family - wife and 2 children. Spending more time in bed than he should. Appetite decreased - "low appetite". Weight loss - 15 pounds.  Sleeps well most nights - takes Temazepam 30mg  at hs. Averages 8 hours. Has tried to leave  off on the weekends and does not sleep well.  Focus and concentration difficulties. Completing tasks. Managing aspects of household. Works for [employer] - [employer]. Goes into the ofice every day. Denies SI or HI. Denies AH or VH.   Past Psychiatric History: Admitted in March 2020 for mania after starting Zoloft.  Past Medical History:  Past Medical History:  Diagnosis Date  . Allergy   . Bipolar 1 disorder [facility](HCC)     Past Surgical History:  Procedure Laterality Date  . HERNIA REPAIR      Family Psychiatric History: Denies   Family History:  Family History  Problem Relation Age of Onset  . Cancer Neg Hx   . Other Neg Hx        low testosterone    Social History:  Social History   Socioeconomic History  . Marital status: Married    Spouse name: Not on file  . Number of children: Not on file  . Years of education: Not on file  . Highest education level: Not on file  Occupational History  . Not on file  Social Needs  . Financial resource strain: Not on file  . Food insecurity    Worry: Not on file    Inability: Not on file  . Transportation needs    Medical: Not on file    Non-medical: Not on file  Tobacco Use  . Smoking status: Current Every Day Smoker    Packs/day: 0.50    Types: Cigarettes, E-cigarettes  . Smokeless tobacco: Never Used  Substance and Sexual Activity  .  Alcohol use: Yes  . Drug use: Not on file  . Sexual activity: Not on file  Lifestyle  . Physical activity    Days per week: Not on file    Minutes per session: Not on file  . Stress: Not on file  Relationships  . Social Musicianconnections    Talks on phone: Not on file    Gets together: Not on file    Attends religious service: Not on file    Active member of club or organization: Not on file    Attends meetings of clubs or organizations: Not on file    Relationship status: Not on file  Other Topics Concern  . Not on file  Social History Narrative  . Not on file    Allergies: No Known  Allergies  Metabolic Disorder Labs: Lab Results  Component Value Date   HGBA1C 5.2 11/03/2017   Lab Results  Component Value Date   PROLACTIN 8.4 12/21/2017   Lab Results  Component Value Date   CHOL 186 03/07/2018   TRIG 97.0 03/07/2018   HDL 45.50 03/07/2018   CHOLHDL 4 03/07/2018   VLDL 19.4 03/07/2018   LDLCALC 121 (H) 03/07/2018   LDLCALC 130 (H) 01/07/2017   Lab Results  Component Value Date   TSH 1.05 12/21/2017   TSH 1.07 11/03/2017    Therapeutic Level Labs: No results found for: LITHIUM No results found for: VALPROATE No components found for:  CBMZ  Current Medications: Current Outpatient Medications  Medication Sig Dispense Refill  . albuterol (PROVENTIL HFA;VENTOLIN HFA) 108 (90 Base) MCG/ACT inhaler Inhale 1 puff into the lungs every 6 (six) hours as needed for wheezing or shortness of breath. (Patient taking differently: Inhale 2 puffs into the lungs 2 (two) times daily as needed for wheezing or shortness of breath. ) 1 Inhaler 2  . benztropine (COGENTIN) 0.5 MG tablet Take 1 tablet (0.5 mg total) by mouth 2 (two) times daily. 60 tablet 2  . carbamazepine (TEGRETOL) 200 MG tablet Take 2 tablets (400 mg total) by mouth at bedtime. 60 tablet 2  . fluticasone (FLONASE) 50 MCG/ACT nasal spray SPRAY 2 SPRAYS INTO EACH NOSTRIL EVERY DAY 16 g 3  . montelukast (SINGULAIR) 10 MG tablet TAKE 1 TABLET BY MOUTH EVERYDAY AT BEDTIME 90 tablet 1  . omega-3 acid ethyl esters (LOVAZA) 1 g capsule Take 1 capsule (1 g total) by mouth 2 (two) times daily. 60 capsule 5  . risperidone (RISPERDAL) 4 MG tablet Take 1 tablet (4 mg total) by mouth at bedtime. 30 tablet 2  . temazepam (RESTORIL) 30 MG capsule Take 1 capsule (30 mg total) by mouth at bedtime. 30 capsule 2  . VITAMIN D, CHOLECALCIFEROL, PO Take 1 capsule by mouth daily.     . sodium chloride HYPERTONIC 3 % nebulizer solution Take by nebulization as needed for other or cough (congestion). 750 mL 12   No current  facility-administered medications for this visit.     Medication Side Effects: none  Orders placed this visit:   Orders Placed This Encounter  Procedures  . Carbamazepine level, total    Psychiatric Specialty Exam:  ROS  There were no vitals taken for this visit.There is no height or weight on file to calculate BMI.  General Appearance: Neat and Well Groomed  Eye Contact:  Good  Speech:  Normal Rate  Volume:  Normal  Mood:  Euthymic  Affect:  Congruent  Thought Process:  Goal Directed  Orientation:  Full (Time, Place,  and Person)  Thought Content: Logical   Suicidal Thoughts:  No  Homicidal Thoughts:  No  Memory:  WNL  Judgement:  Good  Insight:  Good  Psychomotor Activity:  Normal  Concentration:  Concentration: Good  Recall:  NA  Fund of Knowledge: Good  Language: Good  Assets:  Communication Skills Desire for Improvement Financial Resources/Insurance Housing Intimacy Leisure Time Physical Health Resilience Social Support Talents/Skills Transportation Vocational/Educational  ADL's:  Intact  Cognition: WNL  Prognosis:  Good   Screenings:  AIMS     Admission (Discharged) from 04/26/2018 in [facility]  AIMS Total Score  0    AUDIT     Admission (Discharged) from 04/26/2018 in [place] [facility]  Alcohol Use Disorder Identification Test Final Score (AUDIT)  3    PHQ2-9     Office Visit from 04/13/2016 in [facility]  PHQ-2 Total Score  0      Receiving Psychotherapy: Yes   Treatment Plan/Recommendations:  Plan:  1. Cogentin 0.5mg  BID 2. Temazepam 30mg  at hs 3. Carbamazepine 200mg  BID 4. Risperdal 4mg  daily 5. Lab slip given for Tegretol level  RTC 4 weeks  Discussed the importance of staying on medications at this time. Will address increased anxiety after lab work done for Tegretol level.   Consider Buspar 5mg  BID   Patient advised to  contact office with any questions, adverse effects, or acute worsening in signs and symptoms.  Discussed potential metabolic side effects associated with atypical antipsychotics, as well as potential risk for movement side effects. Advised pt to contact office if movement side effects occur.   Discussed potential benefits, risk, and side effects of benzodiazepines to include potential risk of tolerance and dependence, as well as possible drowsiness.  Advised patient not to drive if experiencing drowsiness and to take lowest possible effective dose to minimize risk of dependence and tolerance.  Aloha Gell, NP

## 2018-10-13 ENCOUNTER — Other Ambulatory Visit: Payer: Self-pay | Admitting: Family Medicine

## 2018-10-26 ENCOUNTER — Telehealth: Payer: Self-pay | Admitting: Adult Health

## 2018-10-26 NOTE — Telephone Encounter (Signed)
Rtc to [place], pt's wife and let her know pt's tegretol level was 6.4 and that is in normal range. No changes at this time and keep follow up on 11/01/2018

## 2018-10-26 NOTE — Telephone Encounter (Signed)
Patient's spouse called stated patient went to [facility] in [place]., to get labs drawn 3 weeks ago, have not gotten results back as of yet, he has appointment for 11/01/2018 spouse would like for you to intervene regarding labs, please call this number [phone]

## 2018-11-01 ENCOUNTER — Ambulatory Visit (INDEPENDENT_AMBULATORY_CARE_PROVIDER_SITE_OTHER): Payer: Federal, State, Local not specified - PPO | Admitting: Adult Health

## 2018-11-01 ENCOUNTER — Other Ambulatory Visit: Payer: Self-pay

## 2018-11-01 ENCOUNTER — Encounter: Payer: Self-pay | Admitting: Adult Health

## 2018-11-01 DIAGNOSIS — F4310 Post-traumatic stress disorder, unspecified: Secondary | ICD-10-CM

## 2018-11-01 DIAGNOSIS — G4700 Insomnia, unspecified: Secondary | ICD-10-CM | POA: Diagnosis not present

## 2018-11-01 DIAGNOSIS — F411 Generalized anxiety disorder: Secondary | ICD-10-CM | POA: Diagnosis not present

## 2018-11-01 DIAGNOSIS — F319 Bipolar disorder, unspecified: Secondary | ICD-10-CM

## 2018-11-01 MED ORDER — RISPERIDONE 1 MG PO TABS: 1.0000 mg | ORAL_TABLET | Freq: Every day | ORAL | 2 refills | Status: DC

## 2018-11-01 NOTE — Progress Notes (Signed)
[facility] MD/PA/NP Initial Note  11/01/2018 4:52 PM Christian Baldwin  MRN:  [id number]  Chief Complaint:    HPI:   Visit Diagnosis:    ICD-10-CM   1. Bipolar I disorder (HCC)  F31.9 risperiDONE (RISPERDAL) 1 MG tablet  2. Generalized anxiety disorder  F41.1   3. PTSD (post-traumatic stress disorder)  F43.10   4. Insomnia, unspecified type  G47.00     Wife in attendence  Describes mood today as "ok". Mood symptoms - reports feeling anxious and depressed. Denies irritability. Stating "I'm doing a little worse, maybe". Is having a lot of anxiety and depression. Anxious "all the time". Worried. Used to be very "anal". Wanting things a certain way. Has "no energy". Also stating "I feel like I'm depressed". Sits in the recliner "a lot". Reports racing thoughts - work - car problems - aging parents. Feels like he can trust his wife. Has a secure job in IT.  Feels like there are "3 Chris's". Medication making him feel "too far down". Wife says he is an "admirable man". Served his [place] 22 years. Worried about "where he is". Started losing weight - total of 25 pounds - 15 at last visit. Does not look for things to keep him busy. Has no interests in sexual activitiy. Was active but no longer - not in past 6 months. Has some focus issues at work. In a slow period right now. Worried things will be increased when people come back in the office. Works in Consulting civil engineer.  Energy levels decreased. Active, does not have a regular exercise routine. Works 40 hours a week from home - IT. Enjoys some usual interests and activities. Spending time with family - wife and 2 children.  Appetite decreased.  Weight loss - 25 pounds.  Sleeping difficulties. Temazepam and restoril not working as well as it did. Averages 6 hours a night - "not as good as it was".  Focus and concentration difficulties. Completing tasks. Managing aspects of household. Work going well "for now because we aren't busy". Denies SI or HI. Denies AH  or VH.   Past Psychiatric History: Admitted in March 2020 for mania after starting Zoloft.  Past Medical History:  Past Medical History:  Diagnosis Date  . Allergy   . Bipolar 1 disorder [facility])     Past Surgical History:  Procedure Laterality Date  . HERNIA REPAIR      Family Psychiatric History: Denies   Family History:  Family History  Problem Relation Age of Onset  . Cancer Neg Hx   . Other Neg Hx        low testosterone    Social History:  Social History   Socioeconomic History  . Marital status: Married    Spouse name: Not on file  . Number of children: Not on file  . Years of education: Not on file  . Highest education level: Not on file  Occupational History  . Not on file  Social Needs  . Financial resource strain: Not on file  . Food insecurity    Worry: Not on file    Inability: Not on file  . Transportation needs    Medical: Not on file    Non-medical: Not on file  Tobacco Use  . Smoking status: Current Every Day Smoker    Packs/day: 0.50    Types: Cigarettes, E-cigarettes  . Smokeless tobacco: Never Used  Substance and Sexual Activity  . Alcohol use: Yes  . Drug use: Not on file  .  Sexual activity: Not on file  Lifestyle  . Physical activity    Days per week: Not on file    Minutes per session: Not on file  . Stress: Not on file  Relationships  . Social Herbalist on phone: Not on file    Gets together: Not on file    Attends religious service: Not on file    Active member of club or organization: Not on file    Attends meetings of clubs or organizations: Not on file    Relationship status: Not on file  Other Topics Concern  . Not on file  Social History Narrative  . Not on file    Allergies: No Known Allergies  Metabolic Disorder Labs: Lab Results  Component Value Date   HGBA1C 5.2 11/03/2017   Lab Results  Component Value Date   PROLACTIN 8.4 12/21/2017   Lab Results  Component Value Date   CHOL 186 03/07/2018    TRIG 97.0 03/07/2018   HDL 45.50 03/07/2018   CHOLHDL 4 03/07/2018   VLDL 19.4 03/07/2018   LDLCALC 121 (H) 03/07/2018   LDLCALC 130 (H) 01/07/2017   Lab Results  Component Value Date   TSH 1.05 12/21/2017   TSH 1.07 11/03/2017    Therapeutic Level Labs: No results found for: LITHIUM No results found for: VALPROATE No components found for:  CBMZ  Current Medications: Current Outpatient Medications  Medication Sig Dispense Refill  . albuterol (PROVENTIL HFA;VENTOLIN HFA) 108 (90 Base) MCG/ACT inhaler Inhale 1 puff into the lungs every 6 (six) hours as needed for wheezing or shortness of breath. (Patient taking differently: Inhale 2 puffs into the lungs 2 (two) times daily as needed for wheezing or shortness of breath. ) 1 Inhaler 2  . benztropine (COGENTIN) 0.5 MG tablet Take 1 tablet (0.5 mg total) by mouth 2 (two) times daily. 60 tablet 2  . carbamazepine (TEGRETOL) 200 MG tablet Take 2 tablets (400 mg total) by mouth at bedtime. 60 tablet 2  . fluticasone (FLONASE) 50 MCG/ACT nasal spray SPRAY 2 SPRAYS INTO EACH NOSTRIL EVERY DAY 48 mL 1  . montelukast (SINGULAIR) 10 MG tablet TAKE 1 TABLET BY MOUTH EVERYDAY AT BEDTIME 90 tablet 1  . omega-3 acid ethyl esters (LOVAZA) 1 g capsule Take 1 capsule (1 g total) by mouth 2 (two) times daily. 60 capsule 5  . risperiDONE (RISPERDAL) 1 MG tablet Take 1 tablet (1 mg total) by mouth at bedtime. 30 tablet 2  . risperidone (RISPERDAL) 4 MG tablet Take 1 tablet (4 mg total) by mouth at bedtime. 30 tablet 2  . sodium chloride HYPERTONIC 3 % nebulizer solution Take by nebulization as needed for other or cough (congestion). 750 mL 12  . temazepam (RESTORIL) 30 MG capsule Take 1 capsule (30 mg total) by mouth at bedtime. 30 capsule 2  . VITAMIN D, CHOLECALCIFEROL, PO Take 1 capsule by mouth daily.      No current facility-administered medications for this visit.     Medication Side Effects: none  Orders placed this visit:   No orders of the  defined types were placed in this encounter.   Psychiatric Specialty Exam:  ROS  There were no vitals taken for this visit.There is no height or weight on file to calculate BMI.  General Appearance: Neat and Well Groomed  Eye Contact:  Fair  Speech:  Normal Rate  Volume:  Normal  Mood:  Euthymic  Affect:  Congruent  Thought Process:  Goal  Directed  Orientation:  Full (Time, Place, and Person)  Thought Content: Logical   Suicidal Thoughts:  No  Homicidal Thoughts:  No  Memory:  WNL  Judgement:  Good  Insight:  Good  Psychomotor Activity:  Normal  Concentration:  Concentration: Good  Recall:  NA  Fund of Knowledge: Good  Language: Good  Assets:  Communication Skills Desire for Improvement Financial Resources/Insurance Housing Intimacy Leisure Time Physical Health Resilience Social Support Talents/Skills Transportation Vocational/Educational  ADL's:  Intact  Cognition: WNL  Prognosis:  Good   Screenings:  AIMS     [facility]  AIMS Total Score  0    AUDIT     [facility]  Alcohol Use Disorder Identification Test Final Score (AUDIT)  3    PHQ2-9     Office Visit from 04/13/2016 in [facility]  PHQ-2 Total Score  0      Receiving Psychotherapy: Yes   Treatment Plan/Recommendations:  Plan:  1. Cogentin 0.5mg  BID 2. Temazepam 30mg  at hs 3. Carbamazepine 200mg  BID 4. Risperdal 4mg  daily 5. Add Risperdal 1mg  at hs to target sleep/mood instability  Patient reports compliance with medications.  RTC 4 weeks  Consider Buspar 5mg  BID   Patient advised to contact office with any questions, adverse effects, or acute worsening in signs and symptoms.  Discussed potential metabolic side effects associated with atypical antipsychotics, as well as potential risk for movement  side effects. Advised pt to contact office if movement side effects occur.   Discussed potential benefits, risk, and side effects of benzodiazepines to include potential risk of tolerance and dependence, as well as possible drowsiness.  Advised patient not to drive if experiencing drowsiness and to take lowest possible effective dose to minimize risk of dependence and tolerance.  Dorothyann Gibbs, NP

## 2018-11-04 ENCOUNTER — Other Ambulatory Visit: Payer: Self-pay

## 2018-11-04 ENCOUNTER — Ambulatory Visit (INDEPENDENT_AMBULATORY_CARE_PROVIDER_SITE_OTHER): Payer: Federal, State, Local not specified - PPO

## 2018-11-04 DIAGNOSIS — Z23 Encounter for immunization: Secondary | ICD-10-CM | POA: Diagnosis not present

## 2018-11-04 NOTE — Progress Notes (Signed)
Here for flu shot

## 2018-11-29 ENCOUNTER — Other Ambulatory Visit: Payer: Self-pay

## 2018-11-29 ENCOUNTER — Encounter: Payer: Self-pay | Admitting: Adult Health

## 2018-11-29 ENCOUNTER — Ambulatory Visit (INDEPENDENT_AMBULATORY_CARE_PROVIDER_SITE_OTHER): Payer: Federal, State, Local not specified - PPO | Admitting: Adult Health

## 2018-11-29 DIAGNOSIS — F4310 Post-traumatic stress disorder, unspecified: Secondary | ICD-10-CM

## 2018-11-29 DIAGNOSIS — F319 Bipolar disorder, unspecified: Secondary | ICD-10-CM

## 2018-11-29 DIAGNOSIS — G4700 Insomnia, unspecified: Secondary | ICD-10-CM | POA: Diagnosis not present

## 2018-11-29 DIAGNOSIS — F411 Generalized anxiety disorder: Secondary | ICD-10-CM | POA: Diagnosis not present

## 2018-11-29 MED ORDER — RISPERIDONE 2 MG PO TABS: 2.0000 mg | ORAL_TABLET | Freq: Every day | ORAL | 2 refills | Status: DC

## 2018-11-29 MED ORDER — TEMAZEPAM 30 MG PO CAPS: 30.0000 mg | ORAL_CAPSULE | Freq: Every day | ORAL | 2 refills | Status: DC

## 2018-11-29 NOTE — Progress Notes (Signed)
[facility] MD/PA/NP Medication Check   11/29/2018 6:30 PM Christian Baldwin  MRN:  [id number]  Chief Complaint:    ICD-10-CM   1. Insomnia, unspecified type  G47.00 temazepam (RESTORIL) 30 MG capsule  2. Bipolar I disorder (HCC)  F31.9 risperiDONE (RISPERDAL) 2 MG tablet  3. PTSD (post-traumatic stress disorder)  F43.10   4. Generalized anxiety disorder  F41.1        HPI:   Visit Diagnosis:  Wife in attendence  Describes mood today as "ok". Mood symptoms - decreased anxiety and depression. Denies irritability. Stating "I'm feeling better". Not quite as anxious. Stating "I have no reason to be depressed". Has a supportive wife and family "behind him". Wife and mother worried about his depression. Wanting him to see a therapist, but he has been resistant.  Hard to get going at work - "slipping noticeably". Mind continues to race. Worries about losing his job. Not as focused. Low energy.  Has a sense of insecurity. Lost sense of self confidence. Feels "insecure" with where he is". Improved interest and motivation. Taking medications as prescribed.   Energy levels decreased - "about the same". Active, does not have a regular exercise routine - doing some push-ups - walking dog in the evening. 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  Enjoys some usual interests and activities. Spending time with family - wife and 2 children.  Appetite decreased.  Weight gain 5 pounds - wife notes he has a better appetite.  Sleeping has remained the "same". Averages 6 to 7 hours a night - some improvement.  Focus and concentration difficulties. Completing tasks. Managing aspects of household. Work going "ok". Has a lot of supportive friends at work. Works at [employer] Denies SI or HI. Denies AH or VH.   Past Psychiatric History: Admitted in March 2020 for mania after starting Zoloft.  Past Medical History:  Past Medical History:  Diagnosis Date  . Allergy   . Bipolar 1 disorder [facility])     Past  Surgical History:  Procedure Laterality Date  . HERNIA REPAIR      Family Psychiatric History: Denies   Family History:  Family History  Problem Relation Age of Onset  . Cancer Neg Hx   . Other Neg Hx        low testosterone    Social History:  Social History   Socioeconomic History  . Marital status: Married    Spouse name: Not on file  . Number of children: Not on file  . Years of education: Not on file  . Highest education level: Not on file  Occupational History  . Not on file  Social Needs  . Financial resource strain: Not on file  . Food insecurity    Worry: Not on file    Inability: Not on file  . Transportation needs    Medical: Not on file    Non-medical: Not on file  Tobacco Use  . Smoking status: Current Every Day Smoker    Packs/day: 0.50    Types: Cigarettes, E-cigarettes  . Smokeless tobacco: Never Used  Substance and Sexual Activity  . Alcohol use: Yes  . Drug use: Not on file  . Sexual activity: Not on file  Lifestyle  . Physical activity    Days per week: Not on file    Minutes per session: Not on file  . Stress: Not on file  Relationships  . Social connections    Talks on phone: Not on file  Gets together: Not on file    Attends religious service: Not on file    Active member of club or organization: Not on file    Attends meetings of clubs or organizations: Not on file    Relationship status: Not on file  Other Topics Concern  . Not on file  Social History Narrative  . Not on file    Allergies: No Known Allergies  Metabolic Disorder Labs: Lab Results  Component Value Date   HGBA1C 5.2 11/03/2017   Lab Results  Component Value Date   PROLACTIN 8.4 12/21/2017   Lab Results  Component Value Date   CHOL 186 03/07/2018   TRIG 97.0 03/07/2018   HDL 45.50 03/07/2018   CHOLHDL 4 03/07/2018   VLDL 19.4 03/07/2018   LDLCALC 121 (H) 03/07/2018   LDLCALC 130 (H) 01/07/2017   Lab Results  Component Value Date   TSH 1.05  12/21/2017   TSH 1.07 11/03/2017    Therapeutic Level Labs: No results found for: LITHIUM No results found for: VALPROATE No components found for:  CBMZ  Current Medications: Current Outpatient Medications  Medication Sig Dispense Refill  . albuterol (PROVENTIL HFA;VENTOLIN HFA) 108 (90 Base) MCG/ACT inhaler Inhale 1 puff into the lungs every 6 (six) hours as needed for wheezing or shortness of breath. (Patient taking differently: Inhale 2 puffs into the lungs 2 (two) times daily as needed for wheezing or shortness of breath. ) 1 Inhaler 2  . benztropine (COGENTIN) 0.5 MG tablet Take 1 tablet (0.5 mg total) by mouth 2 (two) times daily. 60 tablet 2  . carbamazepine (TEGRETOL) 200 MG tablet Take 2 tablets (400 mg total) by mouth at bedtime. 60 tablet 2  . fluticasone (FLONASE) 50 MCG/ACT nasal spray SPRAY 2 SPRAYS INTO EACH NOSTRIL EVERY DAY 48 mL 1  . montelukast (SINGULAIR) 10 MG tablet TAKE 1 TABLET BY MOUTH EVERYDAY AT BEDTIME 90 tablet 1  . omega-3 acid ethyl esters (LOVAZA) 1 g capsule Take 1 capsule (1 g total) by mouth 2 (two) times daily. 60 capsule 5  . risperiDONE (RISPERDAL) 2 MG tablet Take 1 tablet (2 mg total) by mouth daily. 30 tablet 2  . risperidone (RISPERDAL) 4 MG tablet Take 1 tablet (4 mg total) by mouth at bedtime. 30 tablet 2  . sodium chloride HYPERTONIC 3 % nebulizer solution Take by nebulization as needed for other or cough (congestion). 750 mL 12  . temazepam (RESTORIL) 30 MG capsule Take 1 capsule (30 mg total) by mouth at bedtime. 30 capsule 2  . VITAMIN D, CHOLECALCIFEROL, PO Take 1 capsule by mouth daily.      No current facility-administered medications for this visit.     Medication Side Effects: none  Orders placed this visit:   No orders of the defined types were placed in this encounter.   Psychiatric Specialty Exam:  Review of Systems  Neurological: Negative for tremors and weakness.    There were no vitals taken for this visit.There is no  height or weight on file to calculate BMI.  General Appearance: Neat and Well Groomed  Eye Contact:  Fair  Speech:  Normal Rate  Volume:  Normal  Mood:  Euthymic  Affect:  Congruent  Thought Process:  Goal Directed  Orientation:  Full (Time, Place, and Person)  Thought Content: Logical   Suicidal Thoughts:  No  Homicidal Thoughts:  No  Memory:  WNL  Judgement:  Good  Insight:  Good  Psychomotor Activity:  Normal  Concentration:  Concentration: Good  Recall:  NA  Fund of Knowledge: Good  Language: Good  Assets:  Communication Skills Desire for Improvement Financial Resources/Insurance Housing Intimacy Leisure Time Physical Health Resilience Social Support Talents/Skills Transportation Vocational/Educational  ADL's:  Intact  Cognition: WNL  Prognosis:  Good   Screenings:  PHQ2-9     Office Visit from 04/13/2016 in [facility] at [facility]  PHQ-2 Total Score  0      Receiving Psychotherapy: Yes   Treatment Plan/Recommendations: Start therapy  Plan:  1. Cogentin 0.5mg  BID 2. Temazepam 30mg  at hs 3. Carbamazepine 200mg  BID 4. Risperdal 4mg  at bedtime 5. Risperdal 2mg  in the morning.   Consider Lamictal and or Wellbutrin XL 150mg  daily  Consider therapy - IOP program  Patient reports compliance with medications.  RTC 4 weeks  Patient advised to contact office with any questions, adverse effects, or acute worsening in signs and symptoms.  Discussed potential metabolic side effects associated with atypical antipsychotics, as well as potential risk for movement side effects. Advised pt to contact office if movement side effects occur.   Discussed potential benefits, risk, and side effects of benzodiazepines to include potential risk of tolerance and dependence, as well as possible drowsiness.  Advised patient not to drive if experiencing drowsiness and to take lowest possible effective dose to minimize risk of dependence and  tolerance.  Aloha Gell, NP

## 2018-12-26 ENCOUNTER — Other Ambulatory Visit: Payer: Self-pay | Admitting: Adult Health

## 2018-12-26 DIAGNOSIS — Z79899 Other long term (current) drug therapy: Secondary | ICD-10-CM

## 2018-12-26 DIAGNOSIS — F319 Bipolar disorder, unspecified: Secondary | ICD-10-CM

## 2018-12-26 DIAGNOSIS — F4310 Post-traumatic stress disorder, unspecified: Secondary | ICD-10-CM

## 2018-12-26 DIAGNOSIS — F411 Generalized anxiety disorder: Secondary | ICD-10-CM

## 2018-12-27 ENCOUNTER — Other Ambulatory Visit: Payer: Self-pay | Admitting: Adult Health

## 2018-12-27 DIAGNOSIS — F319 Bipolar disorder, unspecified: Secondary | ICD-10-CM

## 2018-12-29 ENCOUNTER — Encounter: Payer: Self-pay | Admitting: Adult Health

## 2018-12-29 ENCOUNTER — Ambulatory Visit (INDEPENDENT_AMBULATORY_CARE_PROVIDER_SITE_OTHER): Payer: Federal, State, Local not specified - PPO | Admitting: Adult Health

## 2018-12-29 ENCOUNTER — Other Ambulatory Visit: Payer: Self-pay

## 2018-12-29 DIAGNOSIS — F319 Bipolar disorder, unspecified: Secondary | ICD-10-CM | POA: Diagnosis not present

## 2018-12-29 DIAGNOSIS — F411 Generalized anxiety disorder: Secondary | ICD-10-CM | POA: Diagnosis not present

## 2018-12-29 DIAGNOSIS — F4310 Post-traumatic stress disorder, unspecified: Secondary | ICD-10-CM | POA: Diagnosis not present

## 2018-12-29 DIAGNOSIS — G4700 Insomnia, unspecified: Secondary | ICD-10-CM | POA: Diagnosis not present

## 2018-12-29 DIAGNOSIS — F331 Major depressive disorder, recurrent, moderate: Secondary | ICD-10-CM

## 2018-12-29 MED ORDER — BUPROPION HCL ER (XL) 150 MG PO TB24: ORAL_TABLET | ORAL | 2 refills | Status: DC

## 2018-12-29 NOTE — Progress Notes (Signed)
[facility] MD/PA/NP Medication Check   12/29/2018 7:06 PM [name]  MRN:  [id number]  Chief Complaint:    ICD-10-CM   1. Generalized anxiety disorder  F41.1 buPROPion (WELLBUTRIN XL) 150 MG 24 hr tablet  2. PTSD (post-traumatic stress disorder)  F43.10   3. Insomnia, unspecified type  G47.00   4. Bipolar I disorder (HCC)  F31.9   5. Major depressive disorder, recurrent episode, moderate (HCC)  F33.1 buPROPion (WELLBUTRIN XL) 150 MG 24 hr tablet     HPI:   Visit Diagnosis:   Wife in attendence  Describes mood today as "ok". Mood symptoms - reporting anxiety and depression. Denies irritability. Stating "I'm not where I want to be". Stating "all I think about is getting off of work and laying in bed and zoning out" .Visited mother yesterday and made a list of concerns. Feels anxious and depressed. Anxiety effecting him at work. Mind is racing - Feeling overwhelmed - mind overloaded. Trying to micro manage things around the house and at work. Doesn't know why he can't stay on task. Wife supportive. He is very emotional. Wife stating "he looks like he wants to cry, but can't". Also stating "I think these medications are making me dumber. Medications not working like they were. Feels  weak and tired. Wanting to stop medications - doesn't feel like he needs them. Mowed the grass - "raking the leaves". Stating "it's a battle for me every day". Simple things now difficult. Has a "strategic" mind. Decreased interest and motivation. Taking medications as prescribed.   Energy levels decreased. Has not seen any "real" improvement. - "about the same". Active, does not have a regular exercise routine. Trying to do arm exercises. 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  Enjoys some usual interests and activities. Spending time with family - wife and 2 children. Has to be reminded that he has a "good life". Family supportive. Appetite decreased. Weight loss 2 pounds. Sleeping has declined.  Averages 5 to 6 hours a night.  Focus and concentration difficulties. Completing tasks. Managing aspects of household. Difficulties in the work setting. Concerned he may lose his job.  Denies SI or HI. Denies AH or VH.   Past Psychiatric History: Admitted in March 2020 for mania after starting Zoloft.  Past Medical History:  Past Medical History:  Diagnosis Date  . Allergy   . Bipolar 1 disorder [facility])     Past Surgical History:  Procedure Laterality Date  . HERNIA REPAIR      Family Psychiatric History: Denies   Family History:  Family History  Problem Relation Age of Onset  . Cancer Neg Hx   . Other Neg Hx        low testosterone    Social History:  Social History   Socioeconomic History  . Marital status: Married    Spouse name: Not on file  . Number of children: Not on file  . Years of education: Not on file  . Highest education level: Not on file  Occupational History  . Not on file  Social Needs  . Financial resource strain: Not on file  . Food insecurity    Worry: Not on file    Inability: Not on file  . Transportation needs    Medical: Not on file    Non-medical: Not on file  Tobacco Use  . Smoking status: Current Every Day Smoker    Packs/day: 0.50    Types: Cigarettes, E-cigarettes  . Smokeless tobacco: Never  Used  Substance and Sexual Activity  . Alcohol use: Yes  . Drug use: Not on file  . Sexual activity: Not on file  Lifestyle  . Physical activity    Days per week: Not on file    Minutes per session: Not on file  . Stress: Not on file  Relationships  . Social Herbalist on phone: Not on file    Gets together: Not on file    Attends religious service: Not on file    Active member of club or organization: Not on file    Attends meetings of clubs or organizations: Not on file    Relationship status: Not on file  Other Topics Concern  . Not on file  Social History Narrative  . Not on file    Allergies: No Known  Allergies  Metabolic Disorder Labs: Lab Results  Component Value Date   HGBA1C 5.2 11/03/2017   Lab Results  Component Value Date   PROLACTIN 8.4 12/21/2017   Lab Results  Component Value Date   CHOL 186 03/07/2018   TRIG 97.0 03/07/2018   HDL 45.50 03/07/2018   CHOLHDL 4 03/07/2018   VLDL 19.4 03/07/2018   LDLCALC 121 (H) 03/07/2018   LDLCALC 130 (H) 01/07/2017   Lab Results  Component Value Date   TSH 1.05 12/21/2017   TSH 1.07 11/03/2017    Therapeutic Level Labs: No results found for: LITHIUM No results found for: VALPROATE No components found for:  CBMZ  Current Medications: Current Outpatient Medications  Medication Sig Dispense Refill  . albuterol (PROVENTIL HFA;VENTOLIN HFA) 108 (90 Base) MCG/ACT inhaler Inhale 1 puff into the lungs every 6 (six) hours as needed for wheezing or shortness of breath. (Patient taking differently: Inhale 2 puffs into the lungs 2 (two) times daily as needed for wheezing or shortness of breath. ) 1 Inhaler 2  . benztropine (COGENTIN) 0.5 MG tablet TAKE 1 TABLET (0.5 MG TOTAL) BY MOUTH 2 (TWO) TIMES DAILY. 180 tablet 0  . buPROPion (WELLBUTRIN XL) 150 MG 24 hr tablet Take one tablet in the morning x 7 days, then take two tablets in the morning. 60 tablet 2  . carbamazepine (TEGRETOL) 200 MG tablet TAKE 2 TABLETS (400 MG TOTAL) BY MOUTH AT BEDTIME. 180 tablet 0  . fluticasone (FLONASE) 50 MCG/ACT nasal spray SPRAY 2 SPRAYS INTO EACH NOSTRIL EVERY DAY 48 mL 1  . montelukast (SINGULAIR) 10 MG tablet TAKE 1 TABLET BY MOUTH EVERYDAY AT BEDTIME 90 tablet 1  . omega-3 acid ethyl esters (LOVAZA) 1 g capsule Take 1 capsule (1 g total) by mouth 2 (two) times daily. 60 capsule 5  . risperiDONE (RISPERDAL) 2 MG tablet Take 1 tablet (2 mg total) by mouth daily. 30 tablet 2  . risperidone (RISPERDAL) 4 MG tablet TAKE 1 TABLET (4 MG TOTAL) BY MOUTH AT BEDTIME. 90 tablet 0  . sodium chloride HYPERTONIC 3 % nebulizer solution Take by nebulization as  needed for other or cough (congestion). 750 mL 12  . temazepam (RESTORIL) 30 MG capsule Take 1 capsule (30 mg total) by mouth at bedtime. 30 capsule 2  . VITAMIN D, CHOLECALCIFEROL, PO Take 1 capsule by mouth daily.      No current facility-administered medications for this visit.     Medication Side Effects: none  Orders placed this visit:   No orders of the defined types were placed in this encounter.   Psychiatric Specialty Exam:  Review of Systems  Neurological: Negative  for tremors and weakness.    There were no vitals taken for this visit.There is no height or weight on file to calculate BMI.  General Appearance: Neat and Well Groomed  Eye Contact:  Fair  Speech:  Normal Rate  Volume:  Normal  Mood:  Depressed  Affect:  Congruent  Thought Process:  Coherent and Descriptions of Associations: Intact  Orientation:  Full (Time, Place, and Person)  Thought Content: Logical   Suicidal Thoughts:  No  Homicidal Thoughts:  No  Memory:  WNL  Judgement:  Good  Insight:  Good  Psychomotor Activity:  Normal  Concentration:  Concentration: Good  Recall:  NA  Fund of Knowledge: Good  Language: Good  Assets:  Communication Skills Desire for Improvement Financial Resources/Insurance Housing Intimacy Leisure Time Physical Health Resilience Social Support Talents/Skills Transportation Vocational/Educational  ADL's:  Intact  Cognition: WNL  Prognosis:  Good   Screenings:  AIMS     [facility]  AIMS Total Score  0    AUDIT     [facility]  Alcohol Use Disorder Identification Test Final Score (AUDIT)  3    PHQ2-9     [facility]  PHQ-2 Total Score  0      Receiving Psychotherapy: Yes   Treatment Plan/Recommendations: Start  therapy  Plan:  1. Cogentin 0.5mg  BID 2. Temazepam 30mg  at hs 3. Carbamazepine 200mg  BID 4. Risperdal 4mg  at bedtime 5. D/C Risperdal 2mg  in the morning. . 6. Add Wellbutrin XL 150mg  daily x 7, then increase to 300mg  daily  Start individual therapy.   Consider Lamictal   Consider therapy - IOP program  Patient reports compliance with medications.  RTC 4 weeks  Patient advised to contact office with any questions, adverse effects, or acute worsening in signs and symptoms.  Discussed potential metabolic side effects associated with atypical antipsychotics, as well as potential risk for movement side effects. Advised pt to contact office if movement side effects occur.   Discussed potential benefits, risk, and side effects of benzodiazepines to include potential risk of tolerance and dependence, as well as possible drowsiness.  Advised patient not to drive if experiencing drowsiness and to take lowest possible effective dose to minimize risk of dependence and tolerance.  Dorothyann Gibbsegina N Payslee Bateson, NP

## 2019-01-18 ENCOUNTER — Other Ambulatory Visit: Payer: Self-pay

## 2019-01-18 ENCOUNTER — Ambulatory Visit (INDEPENDENT_AMBULATORY_CARE_PROVIDER_SITE_OTHER): Payer: Federal, State, Local not specified - PPO | Admitting: Adult Health

## 2019-01-18 ENCOUNTER — Encounter: Payer: Self-pay | Admitting: Adult Health

## 2019-01-18 DIAGNOSIS — F319 Bipolar disorder, unspecified: Secondary | ICD-10-CM | POA: Diagnosis not present

## 2019-01-18 DIAGNOSIS — F4310 Post-traumatic stress disorder, unspecified: Secondary | ICD-10-CM

## 2019-01-18 DIAGNOSIS — G4700 Insomnia, unspecified: Secondary | ICD-10-CM

## 2019-01-18 DIAGNOSIS — F411 Generalized anxiety disorder: Secondary | ICD-10-CM | POA: Diagnosis not present

## 2019-01-18 DIAGNOSIS — F331 Major depressive disorder, recurrent, moderate: Secondary | ICD-10-CM

## 2019-01-18 MED ORDER — TEMAZEPAM 30 MG PO CAPS: 30.0000 mg | ORAL_CAPSULE | Freq: Every day | ORAL | 2 refills | Status: DC

## 2019-01-18 NOTE — Progress Notes (Signed)
[facility] MD/PA/NP Medication Check   01/18/2019 3:35 PM Christian Baldwin  MRN:  [id number]  Chief Complaint:    ICD-10-CM   1. Generalized anxiety disorder  F41.1   2. PTSD (post-traumatic stress disorder)  F43.10   3. Insomnia, unspecified type  G47.00 temazepam (RESTORIL) 30 MG capsule  4. Bipolar I disorder (HCC)  F31.9   5. Major depressive disorder, recurrent episode, moderate (HCC)  F33.1      HPI: +  Visit Diagnosis:   Wife in attendence  Describes mood today as "better". Pleasant. Flat. Laughing at times. Reporting some anxiety - mostly at work - "it melts away when I leave the office". Doesn't really feel depressed anymore. Denies irritability. Has been on Wellbutrin XL 300 daily x 2 weeks. Stating "most of the anxiety has gone way. Feeling better at work. Wife has also noticed a positive change. Feels 85% better than he did. Feels calmer. Able to face things easier. Not feeling as overwhelmed. Laughing again "genuinely". Family feels he is doing so much better. He and daughter starting to form a connection again. He and wife involved in marital therapy - "more interactive". Improved interest and motivation. Taking medications as prescribed.  Energy levels improved. Mowing the grass - getting up leaves. Active, does not have a regular exercise routine. Works 40 hours a week. Enjoys some usual interests and activities. Spending time with family - wife and 2 children.  Appetite decreased. Weight stabilizing. Getting appetite back. Looking "forward" to eating.  Sleeping better some than others.  Averages 7 hours.  Focus and concentration has improved. Completing tasks. Managing aspects of household. Work going well.  Denies SI or HI. Denies AH or VH.   Past Psychiatric History: Admitted in March 2020 for mania after starting Zoloft.  Past Medical History:  Past Medical History:  Diagnosis Date  . Allergy   . Bipolar 1 disorder [facility])     Past Surgical History:   Procedure Laterality Date  . HERNIA REPAIR      Family Psychiatric History: Denies   Family History:  Family History  Problem Relation Age of Onset  . Cancer Neg Hx   . Other Neg Hx        low testosterone    Social History:  Social History   Socioeconomic History  . Marital status: Married    Spouse name: Not on file  . Number of children: Not on file  . Years of education: Not on file  . Highest education level: Not on file  Occupational History  . Not on file  Social Needs  . Financial resource strain: Not on file  . Food insecurity    Worry: Not on file    Inability: Not on file  . Transportation needs    Medical: Not on file    Non-medical: Not on file  Tobacco Use  . Smoking status: Current Every Day Smoker    Packs/day: 0.50    Types: Cigarettes, E-cigarettes  . Smokeless tobacco: Never Used  Substance and Sexual Activity  . Alcohol use: Yes  . Drug use: Not on file  . Sexual activity: Not on file  Lifestyle  . Physical activity    Days per week: Not on file    Minutes per session: Not on file  . Stress: Not on file  Relationships  . Social Musician on phone: Not on file    Gets together: Not on file    Attends religious service: Not on  file    Active member of club or organization: Not on file    Attends meetings of clubs or organizations: Not on file    Relationship status: Not on file  Other Topics Concern  . Not on file  Social History Narrative  . Not on file    Allergies: No Known Allergies  Metabolic Disorder Labs: Lab Results  Component Value Date   HGBA1C 5.2 11/03/2017   Lab Results  Component Value Date   PROLACTIN 8.4 12/21/2017   Lab Results  Component Value Date   CHOL 186 03/07/2018   TRIG 97.0 03/07/2018   HDL 45.50 03/07/2018   CHOLHDL 4 03/07/2018   VLDL 19.4 03/07/2018   LDLCALC 121 (H) 03/07/2018   LDLCALC 130 (H) 01/07/2017   Lab Results  Component Value Date   TSH 1.05 12/21/2017   TSH 1.07  11/03/2017    Therapeutic Level Labs: No results found for: LITHIUM No results found for: VALPROATE No components found for:  CBMZ  Current Medications: Current Outpatient Medications  Medication Sig Dispense Refill  . albuterol (PROVENTIL HFA;VENTOLIN HFA) 108 (90 Base) MCG/ACT inhaler Inhale 1 puff into the lungs every 6 (six) hours as needed for wheezing or shortness of breath. (Patient taking differently: Inhale 2 puffs into the lungs 2 (two) times daily as needed for wheezing or shortness of breath. ) 1 Inhaler 2  . benztropine (COGENTIN) 0.5 MG tablet TAKE 1 TABLET (0.5 MG TOTAL) BY MOUTH 2 (TWO) TIMES DAILY. 180 tablet 0  . buPROPion (WELLBUTRIN XL) 150 MG 24 hr tablet Take one tablet in the morning x 7 days, then take two tablets in the morning. 60 tablet 2  . carbamazepine (TEGRETOL) 200 MG tablet TAKE 2 TABLETS (400 MG TOTAL) BY MOUTH AT BEDTIME. 180 tablet 0  . fluticasone (FLONASE) 50 MCG/ACT nasal spray SPRAY 2 SPRAYS INTO EACH NOSTRIL EVERY DAY 48 mL 1  . montelukast (SINGULAIR) 10 MG tablet TAKE 1 TABLET BY MOUTH EVERYDAY AT BEDTIME 90 tablet 1  . omega-3 acid ethyl esters (LOVAZA) 1 g capsule Take 1 capsule (1 g total) by mouth 2 (two) times daily. 60 capsule 5  . risperiDONE (RISPERDAL) 2 MG tablet Take 1 tablet (2 mg total) by mouth daily. 30 tablet 2  . risperidone (RISPERDAL) 4 MG tablet TAKE 1 TABLET (4 MG TOTAL) BY MOUTH AT BEDTIME. 90 tablet 0  . sodium chloride HYPERTONIC 3 % nebulizer solution Take by nebulization as needed for other or cough (congestion). 750 mL 12  . temazepam (RESTORIL) 30 MG capsule Take 1 capsule (30 mg total) by mouth at bedtime. 30 capsule 2  . VITAMIN D, CHOLECALCIFEROL, PO Take 1 capsule by mouth daily.      No current facility-administered medications for this visit.     Medication Side Effects: none  Orders placed this visit:   No orders of the defined types were placed in this encounter.   Psychiatric Specialty Exam:  Review of  Systems  Musculoskeletal: Negative for falls.  Neurological: Negative for tremors and weakness.  Psychiatric/Behavioral: Negative for depression, hallucinations and suicidal ideas. The patient does not have insomnia.     There were no vitals taken for this visit.There is no height or weight on file to calculate BMI.  General Appearance: Neat and Well Groomed  Eye Contact:  Fair  Speech:  Normal Rate  Volume:  Normal  Mood:  Euthymic  Affect:  Congruent  Thought Process:  Coherent and Descriptions of Associations: Intact  Orientation:  Full (Time, Place, and Person)  Thought Content: Logical   Suicidal Thoughts:  No  Homicidal Thoughts:  No  Memory:  WNL  Judgement:  Good  Insight:  Good  Psychomotor Activity:  Normal  Concentration:  Concentration: Good  Recall:  NA  Fund of Knowledge: Good  Language: Good  Assets:  Communication Skills Desire for Improvement Financial Resources/Insurance Housing Intimacy Leisure Time Physical Health Resilience Social Support Talents/Skills Transportation Vocational/Educational  ADL's:  Intact  Cognition: WNL  Prognosis:  Good   Screenings:  PHQ2-9     Office Visit from 04/13/2016 in [employer] at [facility]  PHQ-2 Total Score  0      Receiving Psychotherapy: Yes   Treatment Plan/Recommendations: Start therapy  Plan:  1. Cogentin 0.5mg  BID 2. Temazepam 30mg  at hs 3. Carbamazepine 200mg  BID 4. Risperdal 4mg  at bedtime 5. Wellbutrin XL 300mg  daily   Start individual therapy.   Consider Lamictal   Patient reports compliance with medications.  RTC 4 weeks  Patient advised to contact office with any questions, adverse effects, or acute worsening in signs and symptoms.  Discussed potential metabolic side effects associated with atypical antipsychotics, as well as potential risk for movement side effects. Advised pt to contact office if movement side effects occur.   Discussed potential  benefits, risk, and side effects of benzodiazepines to include potential risk of tolerance and dependence, as well as possible drowsiness.  Advised patient not to drive if experiencing drowsiness and to take lowest possible effective dose to minimize risk of dependence and tolerance.  Aloha Gell, NP

## 2019-01-21 ENCOUNTER — Other Ambulatory Visit: Payer: Self-pay | Admitting: Adult Health

## 2019-01-21 DIAGNOSIS — F411 Generalized anxiety disorder: Secondary | ICD-10-CM

## 2019-01-21 DIAGNOSIS — F331 Major depressive disorder, recurrent, moderate: Secondary | ICD-10-CM

## 2019-01-23 ENCOUNTER — Other Ambulatory Visit: Payer: Self-pay | Admitting: Adult Health

## 2019-01-23 DIAGNOSIS — F319 Bipolar disorder, unspecified: Secondary | ICD-10-CM

## 2019-01-26 ENCOUNTER — Ambulatory Visit: Payer: Federal, State, Local not specified - PPO | Admitting: Adult Health

## 2019-02-15 ENCOUNTER — Encounter: Payer: Self-pay | Admitting: Adult Health

## 2019-02-15 ENCOUNTER — Ambulatory Visit (INDEPENDENT_AMBULATORY_CARE_PROVIDER_SITE_OTHER): Payer: Federal, State, Local not specified - PPO | Admitting: Adult Health

## 2019-02-15 DIAGNOSIS — G4700 Insomnia, unspecified: Secondary | ICD-10-CM

## 2019-02-15 DIAGNOSIS — F331 Major depressive disorder, recurrent, moderate: Secondary | ICD-10-CM | POA: Diagnosis not present

## 2019-02-15 DIAGNOSIS — F319 Bipolar disorder, unspecified: Secondary | ICD-10-CM

## 2019-02-15 DIAGNOSIS — F4310 Post-traumatic stress disorder, unspecified: Secondary | ICD-10-CM | POA: Diagnosis not present

## 2019-02-15 DIAGNOSIS — F411 Generalized anxiety disorder: Secondary | ICD-10-CM

## 2019-02-15 NOTE — Progress Notes (Signed)
Christian Baldwin [id number] 1975/12/27 44 y.o.  Virtual Visit via Telephone Note  I connected with pt on 02/15/19 at  3:00 PM EST by telephone and verified that I am speaking with the correct person using two identifiers.   I discussed the limitations, risks, security and privacy concerns of performing an evaluation and management service by telephone and the availability of in person appointments. I also discussed with the patient that there may be a patient responsible charge related to this service. The patient expressed understanding and agreed to proceed.   I discussed the assessment and treatment plan with the patient. The patient was provided an opportunity to ask questions and all were answered. The patient agreed with the plan and demonstrated an understanding of the instructions.   The patient was advised to call back or seek an in-person evaluation if the symptoms worsen or if the condition fails to improve as anticipated.  I provided 30 minutes of non-face-to-face time during this encounter.  The patient was located at home.  The provider was located at [facility].   Dorothyann Gibbs, NP   Subjective:   Patient ID:  Christian Baldwin is a 44 y.o. (DOB March 08, 1975) male.  Chief Complaint:  Chief Complaint  Patient presents with  . Anxiety  . Insomnia  . Trauma  . Depression  . Other    Bipolar 1    HPI  Describes mood today as "better". Pleasant. Mood symptoms - feels anxious. Denies depression and irritability. Stating "I'm feeling pretty good overall". Feels like Wellbutrin has really helped. Still gets nervous in work setting - stressful at times. Has a project due on Friday and is "struggling" with it. 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 Does better at when he's at home. Doesn't stress about work when he's at home. Wife also on call and noted that he has really "turned around". She feels like there is "a lot of positive changes". More involved with  children and family - "interactive and laughing". Has been able to be "intimate" again. Improved interest and motivation. Taking medications as prescribed.  Energy levels improved. Active, does not have a regular exercise routine. Works 40 hours a week. Enjoys some usual interests and activities. Married. Lives with wife and 2 children. Spending time with family.  Appetite adequate. Weight stable.  Sleeping well most nights. Averages 8 hours. No longer waking up in the middle of the night.  Focus and concentration has improved. Completing tasks. Managing aspects of household. Work going well - "stressfull"  Denies SI or HI. Denies AH or VH.   Past Psychiatric History: Admitted in March 2020 for mania after starting Zoloft.  Review of Systems:  Review of Systems  Musculoskeletal: Negative for gait problem.  Neurological: Negative for tremors.  Psychiatric/Behavioral:       Please refer to HPI    Medications: I have reviewed the patient's current medications.  Current Outpatient Medications  Medication Sig Dispense Refill  . albuterol (PROVENTIL HFA;VENTOLIN HFA) 108 (90 Base) MCG/ACT inhaler Inhale 1 puff into the lungs every 6 (six) hours as needed for wheezing or shortness of breath. (Patient taking differently: Inhale 2 puffs into the lungs 2 (two) times daily as needed for wheezing or shortness of breath. ) 1 Inhaler 2  . benztropine (COGENTIN) 0.5 MG tablet TAKE 1 TABLET (0.5 MG TOTAL) BY MOUTH 2 (TWO) TIMES DAILY. 180 tablet 0  . buPROPion (WELLBUTRIN XL) 150 MG 24 hr tablet TAKE ONE TABLET IN THE MORNING X 7  DAYS, THEN TAKE TWO TABLETS IN THE MORNING. 180 tablet 1  . carbamazepine (TEGRETOL) 200 MG tablet TAKE 2 TABLETS (400 MG TOTAL) BY MOUTH AT BEDTIME. 180 tablet 0  . fluticasone (FLONASE) 50 MCG/ACT nasal spray SPRAY 2 SPRAYS INTO EACH NOSTRIL EVERY DAY 48 mL 1  . montelukast (SINGULAIR) 10 MG tablet TAKE 1 TABLET BY MOUTH EVERYDAY AT BEDTIME 90 tablet 1  . omega-3 acid ethyl  esters (LOVAZA) 1 g capsule Take 1 capsule (1 g total) by mouth 2 (two) times daily. 60 capsule 5  . risperiDONE (RISPERDAL) 2 MG tablet Take 1 tablet (2 mg total) by mouth daily. 30 tablet 2  . risperidone (RISPERDAL) 4 MG tablet TAKE 1 TABLET (4 MG TOTAL) BY MOUTH AT BEDTIME. 90 tablet 0  . sodium chloride HYPERTONIC 3 % nebulizer solution Take by nebulization as needed for other or cough (congestion). 750 mL 12  . temazepam (RESTORIL) 30 MG capsule Take 1 capsule (30 mg total) by mouth at bedtime. 30 capsule 2  . VITAMIN D, CHOLECALCIFEROL, PO Take 1 capsule by mouth daily.      No current facility-administered medications for this visit.    Medication Side Effects: None  Allergies: No Known Allergies  Past Medical History:  Diagnosis Date  . Allergy   . Bipolar 1 disorder (HCC)     Family History  Problem Relation Age of Onset  . Cancer Neg Hx   . Other Neg Hx        low testosterone    Social History   Socioeconomic History  . Marital status: Married    Spouse name: Not on file  . Number of children: Not on file  . Years of education: Not on file  . Highest education level: Not on file  Occupational History  . Not on file  Tobacco Use  . Smoking status: Current Every Day Smoker    Packs/day: 0.50    Types: Cigarettes, E-cigarettes  . Smokeless tobacco: Never Used  Substance and Sexual Activity  . Alcohol use: Yes  . Drug use: Not on file  . Sexual activity: Not on file  Other Topics Concern  . Not on file  Social History Narrative  . Not on file   Social Determinants of Health   Financial Resource Strain:   . Difficulty of Paying Living Expenses: Not on file  Food Insecurity:   . Worried About Programme researcher, broadcasting/film/video in the Last Year: Not on file  . Ran Out of Food in the Last Year: Not on file  Transportation Needs:   . Lack of Transportation (Medical): Not on file  . Lack of Transportation (Non-Medical): Not on file  Physical Activity:   . Days of  Exercise per Week: Not on file  . Minutes of Exercise per Session: Not on file  Stress:   . Feeling of Stress : Not on file  Social Connections:   . Frequency of Communication with Friends and Family: Not on file  . Frequency of Social Gatherings with Friends and Family: Not on file  . Attends Religious Services: Not on file  . Active Member of Clubs or Organizations: Not on file  . Attends Banker Meetings: Not on file  . Marital Status: Not on file  Intimate Partner Violence:   . Fear of Current or Ex-Partner: Not on file  . Emotionally Abused: Not on file  . Physically Abused: Not on file  . Sexually Abused: Not on file  Past Medical History, Surgical history, Social history, and Family history were reviewed and updated as appropriate.   Please see review of systems for further details on the patient's review from today.   Objective:   Physical Exam:  There were no vitals taken for this visit.  Physical Exam Neurological:     Mental Status: He is alert and oriented to person, place, and time.     Cranial Nerves: No dysarthria.  Psychiatric:        Attention and Perception: Attention and perception normal.        Mood and Affect: Mood normal.        Speech: Speech normal.        Behavior: Behavior is cooperative.        Thought Content: Thought content normal. Thought content is not paranoid or delusional. Thought content does not include homicidal or suicidal ideation. Thought content does not include homicidal or suicidal plan.        Cognition and Memory: Cognition and memory normal.        Judgment: Judgment normal.     Comments: Insight intact     Lab Review:     Component Value Date/Time   NA 141 04/26/2018 1021   K 3.9 04/26/2018 1021   CL 105 04/26/2018 1021   CO2 28 04/26/2018 1021   GLUCOSE 98 04/26/2018 1021   BUN 8 04/26/2018 1021   CREATININE 0.72 04/26/2018 1021   CALCIUM 9.1 04/26/2018 1021   PROT 7.9 04/26/2018 1021   ALBUMIN 4.6  04/26/2018 1021   AST 31 04/26/2018 1021   ALT 44 04/26/2018 1021   ALKPHOS 93 04/26/2018 1021   BILITOT 0.6 04/26/2018 1021   GFRNONAA >60 04/26/2018 1021   GFRAA >60 04/26/2018 1021       Component Value Date/Time   WBC 8.9 04/26/2018 1021   RBC 5.36 04/26/2018 1021   HGB 16.6 04/26/2018 1021   HCT 50.7 04/26/2018 1021   PLT 216 04/26/2018 1021   MCV 94.6 04/26/2018 1021   MCH 31.0 04/26/2018 1021   MCHC 32.7 04/26/2018 1021   RDW 12.6 04/26/2018 1021   LYMPHSABS 1.4 04/26/2018 1021   MONOABS 0.7 04/26/2018 1021   EOSABS 0.1 04/26/2018 1021   BASOSABS 0.0 04/26/2018 1021    No results found for: POCLITH, LITHIUM   No results found for: PHENYTOIN, PHENOBARB, VALPROATE, CBMZ   .res Assessment: Plan:    Plan:  1. Cogentin 0.5mg  BID 2. Temazepam 30mg  at hs 3. Carbamazepine 200mg  BID 4. Risperdal 4mg  at bedtime 5. Wellbutrin XL 300mg  daily   Start individual therapy.   Consider Lamictal   Patient reports compliance with medications.  RTC 4 weeks  Patient advised to contact office with any questions, adverse effects, or acute worsening in signs and symptoms.  Discussed potential metabolic side effects associated with atypical antipsychotics, as well as potential risk for movement side effects. Advised pt to contact office if movement side effects occur.   Discussed potential benefits, risk, and side effects of benzodiazepines to include potential risk of tolerance and dependence, as well as possible drowsiness.  Advised patient not to drive if experiencing drowsiness and to take lowest possible effective dose to minimize risk of dependence and tolerance.  Christian Baldwin was seen today for anxiety, insomnia, trauma, depression and other.  Diagnoses and all orders for this visit:  Generalized anxiety disorder  PTSD (post-traumatic stress disorder)  Insomnia, unspecified type  Major depressive disorder, recurrent episode, moderate (HCC)  Bipolar I  disorder  [facility])    Please see After Visit Summary for patient specific instructions.  No future appointments.  No orders of the defined types were placed in this encounter.     -------------------------------

## 2019-03-23 ENCOUNTER — Encounter: Payer: Self-pay | Admitting: Adult Health

## 2019-03-23 ENCOUNTER — Ambulatory Visit (INDEPENDENT_AMBULATORY_CARE_PROVIDER_SITE_OTHER): Payer: Federal, State, Local not specified - PPO | Admitting: Adult Health

## 2019-03-23 DIAGNOSIS — F331 Major depressive disorder, recurrent, moderate: Secondary | ICD-10-CM

## 2019-03-23 DIAGNOSIS — G4700 Insomnia, unspecified: Secondary | ICD-10-CM | POA: Diagnosis not present

## 2019-03-23 DIAGNOSIS — F411 Generalized anxiety disorder: Secondary | ICD-10-CM | POA: Diagnosis not present

## 2019-03-23 DIAGNOSIS — F4310 Post-traumatic stress disorder, unspecified: Secondary | ICD-10-CM | POA: Diagnosis not present

## 2019-03-23 DIAGNOSIS — Z79899 Other long term (current) drug therapy: Secondary | ICD-10-CM

## 2019-03-23 DIAGNOSIS — F319 Bipolar disorder, unspecified: Secondary | ICD-10-CM

## 2019-03-23 MED ORDER — BENZTROPINE MESYLATE 0.5 MG PO TABS: 0.5000 mg | ORAL_TABLET | Freq: Two times a day (BID) | ORAL | 0 refills | Status: DC

## 2019-03-23 MED ORDER — CARBAMAZEPINE 200 MG PO TABS: 400.0000 mg | ORAL_TABLET | Freq: Every day | ORAL | 0 refills | Status: DC

## 2019-03-23 MED ORDER — TEMAZEPAM 30 MG PO CAPS: 30.0000 mg | ORAL_CAPSULE | Freq: Every day | ORAL | 2 refills | Status: DC

## 2019-03-23 NOTE — Progress Notes (Signed)
Christian Baldwin [id number] January 03, 1976 44 y.o.  Virtual Visit via Telephone Note  I connected with pt on 03/23/19 at  3:20 PM EST by telephone and verified that I am speaking with the correct person using two identifiers.   I discussed the limitations, risks, security and privacy concerns of performing an evaluation and management service by telephone and the availability of in person appointments. I also discussed with the patient that there may be a patient responsible charge related to this service. The patient expressed understanding and agreed to proceed.   I discussed the assessment and treatment plan with the patient. The patient was provided an opportunity to ask questions and all were answered. The patient agreed with the plan and demonstrated an understanding of the instructions.   The patient was advised to call back or seek an in-person evaluation if the symptoms worsen or if the condition fails to improve as anticipated.  I provided 30 minutes of non-face-to-face time during this encounter.  The patient was located at home.  The provider was located at [facility].   Dorothyann Gibbs, NP   Subjective:   Patient ID:  Christian Baldwin is a 44 y.o. (DOB 1975/07/13) male.  Chief Complaint:  Chief Complaint  Patient presents with  . Anxiety  . Depression  . Insomnia  . Trauma  . Other    PTSD, BPD1     HPI Christian Baldwin presents for follow-up of GAD, MDD, insomnia, BPD-1, and PTSD  Describes mood today as "ok". Pleasant. Mood symptoms - feels anxious "some days". Having "a little more anxiety in the work setting". Denies depression and irritability. Feels like he is at 85%. Stating "I'm feeling alright". 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 Does "better" when he's at home. Stating "I'm stress free at home". Involved with children more - playing board games. He and wife getting along well. Wife feels his personality is "really coming  through now". Co-worker has commented on progress.Improved interest and motivation. Taking medications as prescribed.  Energy levels improved. Active, does not have a regular exercise routine. Works 40 hours a week. Enjoys some usual interests and activities. Married. Lives with wife of 17 years and 2 children. Spending time with family.  Appetite adequate. Weight stable - 170. Sleeping well most nights. Averages 8 hours. If he wakes up can go right back to sleep. Feels tired throughout the day. Focus and concentration has improved. Completing tasks. Managing aspects of household. Work going well. Denies SI or HI. Denies AH or VH.   Past Psychiatric History: Admitted in March 2020 for mania after starting Zoloft.  Review of Systems:  Review of Systems  Musculoskeletal: Negative for gait problem.  Neurological: Negative for tremors.  Psychiatric/Behavioral:       Please refer to HPI    Medications: I have reviewed the patient's current medications.  Current Outpatient Medications  Medication Sig Dispense Refill  . albuterol (PROVENTIL HFA;VENTOLIN HFA) 108 (90 Base) MCG/ACT inhaler Inhale 1 puff into the lungs every 6 (six) hours as needed for wheezing or shortness of breath. (Patient taking differently: Inhale 2 puffs into the lungs 2 (two) times daily as needed for wheezing or shortness of breath. ) 1 Inhaler 2  . benztropine (COGENTIN) 0.5 MG tablet Take 1 tablet (0.5 mg total) by mouth 2 (two) times daily. 180 tablet 0  . buPROPion (WELLBUTRIN XL) 150 MG 24 hr tablet TAKE ONE TABLET IN THE MORNING X 7 DAYS, THEN TAKE TWO TABLETS IN  THE MORNING. 180 tablet 1  . carbamazepine (TEGRETOL) 200 MG tablet Take 2 tablets (400 mg total) by mouth at bedtime. 180 tablet 0  . fluticasone (FLONASE) 50 MCG/ACT nasal spray SPRAY 2 SPRAYS INTO EACH NOSTRIL EVERY DAY 48 mL 1  . montelukast (SINGULAIR) 10 MG tablet TAKE 1 TABLET BY MOUTH EVERYDAY AT BEDTIME 90 tablet 1  . omega-3 acid ethyl esters  (LOVAZA) 1 g capsule Take 1 capsule (1 g total) by mouth 2 (two) times daily. 60 capsule 5  . risperiDONE (RISPERDAL) 2 MG tablet Take 1 tablet (2 mg total) by mouth daily. 30 tablet 2  . risperidone (RISPERDAL) 4 MG tablet TAKE 1 TABLET (4 MG TOTAL) BY MOUTH AT BEDTIME. 90 tablet 0  . sodium chloride HYPERTONIC 3 % nebulizer solution Take by nebulization as needed for other or cough (congestion). 750 mL 12  . temazepam (RESTORIL) 30 MG capsule Take 1 capsule (30 mg total) by mouth at bedtime. 30 capsule 2  . VITAMIN D, CHOLECALCIFEROL, PO Take 1 capsule by mouth daily.      No current facility-administered medications for this visit.    Medication Side Effects: None  Allergies: No Known Allergies  Past Medical History:  Diagnosis Date  . Allergy   . Bipolar 1 disorder ([place])     Family History  Problem Relation Age of Onset  . Cancer Neg Hx   . Other Neg Hx        low testosterone    Social History   Socioeconomic History  . Marital status: Married    Spouse name: Not on file  . Number of children: Not on file  . Years of education: Not on file  . Highest education level: Not on file  Occupational History  . Not on file  Tobacco Use  . Smoking status: Current Every Day Smoker    Packs/day: 0.50    Types: Cigarettes, E-cigarettes  . Smokeless tobacco: Never Used  Substance and Sexual Activity  . Alcohol use: Yes  . Drug use: Not on file  . Sexual activity: Not on file  Other Topics Concern  . Not on file  Social History Narrative  . Not on file   Social Determinants of Health   Financial Resource Strain:   . Difficulty of Paying Living Expenses: Not on file  Food Insecurity:   . Worried About Charity fundraiser in the Last Year: Not on file  . Ran Out of Food in the Last Year: Not on file  Transportation Needs:   . Lack of Transportation (Medical): Not on file  . Lack of Transportation (Non-Medical): Not on file  Physical Activity:   . Days of Exercise  per Week: Not on file  . Minutes of Exercise per Session: Not on file  Stress:   . Feeling of Stress : Not on file  Social Connections:   . Frequency of Communication with Friends and Family: Not on file  . Frequency of Social Gatherings with Friends and Family: Not on file  . Attends Religious Services: Not on file  . Active Member of Clubs or Organizations: Not on file  . Attends Archivist Meetings: Not on file  . Marital Status: Not on file  Intimate Partner Violence:   . Fear of Current or Ex-Partner: Not on file  . Emotionally Abused: Not on file  . Physically Abused: Not on file  . Sexually Abused: Not on file    Past Medical History, Surgical history,  Social history, and Family history were reviewed and updated as appropriate.   Please see review of systems for further details on the patient's review from today.   Objective:   Physical Exam:  There were no vitals taken for this visit.  Physical Exam Psychiatric:        Mood and Affect: Mood is anxious.    Lab Review:     Component Value Date/Time   NA 141 04/26/2018 1021   K 3.9 04/26/2018 1021   CL 105 04/26/2018 1021   CO2 28 04/26/2018 1021   GLUCOSE 98 04/26/2018 1021   BUN 8 04/26/2018 1021   CREATININE 0.72 04/26/2018 1021   CALCIUM 9.1 04/26/2018 1021   PROT 7.9 04/26/2018 1021   ALBUMIN 4.6 04/26/2018 1021   AST 31 04/26/2018 1021   ALT 44 04/26/2018 1021   ALKPHOS 93 04/26/2018 1021   BILITOT 0.6 04/26/2018 1021   GFRNONAA >60 04/26/2018 1021   GFRAA >60 04/26/2018 1021       Component Value Date/Time   WBC 8.9 04/26/2018 1021   RBC 5.36 04/26/2018 1021   HGB 16.6 04/26/2018 1021   HCT 50.7 04/26/2018 1021   PLT 216 04/26/2018 1021   MCV 94.6 04/26/2018 1021   MCH 31.0 04/26/2018 1021   MCHC 32.7 04/26/2018 1021   RDW 12.6 04/26/2018 1021   LYMPHSABS 1.4 04/26/2018 1021   MONOABS 0.7 04/26/2018 1021   EOSABS 0.1 04/26/2018 1021   BASOSABS 0.0 04/26/2018 1021    No  results found for: POCLITH, LITHIUM   No results found for: PHENYTOIN, PHENOBARB, VALPROATE, CBMZ   .res Assessment: Plan:    Plan:  1. Cogentin 0.5mg  BID 2. Temazepam 30mg  at hs 3. Carbamazepine 200mg  BID 4. Risperdal 4mg  at bedtime 5. Wellbutrin XL 300mg  daily   Consider therapy - CBT  Consider Lamictal   Patient reports compliance with medications.  RTC 4 weeks  Patient advised to contact office with any questions, adverse effects, or acute worsening in signs and symptoms.  Discussed potential metabolic side effects associated with atypical antipsychotics, as well as potential risk for movement side effects. Advised pt to contact office if movement side effects occur.   Discussed potential benefits, risk, and side effects of benzodiazepines to include potential risk of tolerance and dependence, as well as possible drowsiness.  Advised patient not to drive if experiencing drowsiness and to take lowest possible effective dose to minimize risk of dependence and tolerance.   [place] was seen today for anxiety, depression, insomnia, trauma and other.  Diagnoses and all orders for this visit:  Generalized anxiety disorder  PTSD (post-traumatic stress disorder)  Insomnia, unspecified type -     temazepam (RESTORIL) 30 MG capsule; Take 1 capsule (30 mg total) by mouth at bedtime.  Major depressive disorder, recurrent episode, moderate (HCC)  Bipolar I disorder (HCC) -     carbamazepine (TEGRETOL) 200 MG tablet; Take 2 tablets (400 mg total) by mouth at bedtime. -     benztropine (COGENTIN) 0.5 MG tablet; Take 1 tablet (0.5 mg total) by mouth 2 (two) times daily.  High risk medication use -     benztropine (COGENTIN) 0.5 MG tablet; Take 1 tablet (0.5 mg total) by mouth 2 (two) times daily.    Please see After Visit Summary for patient specific instructions.  No future appointments.  No orders of the defined types were placed in this encounter.      -------------------------------

## 2019-03-28 ENCOUNTER — Other Ambulatory Visit: Payer: Self-pay | Admitting: Adult Health

## 2019-03-28 DIAGNOSIS — F4310 Post-traumatic stress disorder, unspecified: Secondary | ICD-10-CM

## 2019-03-28 DIAGNOSIS — F319 Bipolar disorder, unspecified: Secondary | ICD-10-CM

## 2019-03-28 DIAGNOSIS — F411 Generalized anxiety disorder: Secondary | ICD-10-CM

## 2019-05-02 ENCOUNTER — Encounter: Payer: Self-pay | Admitting: Adult Health

## 2019-05-02 ENCOUNTER — Ambulatory Visit (INDEPENDENT_AMBULATORY_CARE_PROVIDER_SITE_OTHER): Payer: Federal, State, Local not specified - PPO | Admitting: Adult Health

## 2019-05-02 DIAGNOSIS — F4310 Post-traumatic stress disorder, unspecified: Secondary | ICD-10-CM

## 2019-05-02 DIAGNOSIS — F331 Major depressive disorder, recurrent, moderate: Secondary | ICD-10-CM

## 2019-05-02 DIAGNOSIS — F411 Generalized anxiety disorder: Secondary | ICD-10-CM | POA: Diagnosis not present

## 2019-05-02 DIAGNOSIS — G4700 Insomnia, unspecified: Secondary | ICD-10-CM

## 2019-05-02 DIAGNOSIS — F319 Bipolar disorder, unspecified: Secondary | ICD-10-CM | POA: Diagnosis not present

## 2019-05-02 NOTE — Progress Notes (Signed)
Christian Baldwin [id number] [birth year] 44 y.o.  Virtual Visit via Telephone Note  I connected with pt on 05/02/19 at  3:00 PM EDT by telephone and verified that I am speaking with the correct person using two identifiers.   I discussed the limitations, risks, security and privacy concerns of performing an evaluation and management service by telephone and the availability of in person appointments. I also discussed with the patient that there may be a patient responsible charge related to this service. The patient expressed understanding and agreed to proceed.   I discussed the assessment and treatment plan with the patient. The patient was provided an opportunity to ask questions and all were answered. The patient agreed with the plan and demonstrated an understanding of the instructions.   The patient was advised to call back or seek an in-person evaluation if the symptoms worsen or if the condition fails to improve as anticipated.  I provided 30 minutes of non-face-to-face time during this encounter.  The patient was located at home.  The provider was located at [place].   Aloha Gell, NP   Subjective:   Patient ID:  Christian Baldwin is a 44 y.o. (DOB April 13, 1975) male.  Chief Complaint: No chief complaint on file.   HPI Christian Baldwin presents for follow-up of GAD, MDD, insomnia, BPD-1, and PTSD  Describes mood today as "alright". Pleasant. Mood symptoms - feels anxious at times. Denies depression and irritability. Feels like he is 85% back to himself. Stating "I feel pretty good". His boss is back in the office - "more stressful". Gets along well with boss -"I just get anxious about doing a good job". Family doing well - "more interactive". Improved interest and motivation. Taking medications as prescribed.  Energy levels improved - "still tired some days". Active, does not have a regular exercise routine. Works 40 hours a week. Enjoys some usual  interests and activities. Married. Lives with wife of 17 years and 2 children. Spending time with family.  Appetite adequate. Weight stable - 165. Previously weight 200+. Feels cold at times. Has lost muscle mass.  Sleeping well most nights. Averages 8 hours.  Focus and concentration stable. Completing tasks. Managing aspects of household. Work going well. Denies SI or HI. Denies AH or VH.   Spoke with wife - she feels husband is doing well. Agrees with his report.   Past Psychiatric History: Admitted in March 2020 for mania after starting Zoloft.   Review of Systems:  Review of Systems  Musculoskeletal: Negative for gait problem.  Neurological: Negative for tremors.  Psychiatric/Behavioral:       Please refer to HPI    Medications: I have reviewed the patient's current medications.  Current Outpatient Medications  Medication Sig Dispense Refill  . albuterol (PROVENTIL HFA;VENTOLIN HFA) 108 (90 Base) MCG/ACT inhaler Inhale 1 puff into the lungs every 6 (six) hours as needed for wheezing or shortness of breath. (Patient taking differently: Inhale 2 puffs into the lungs 2 (two) times daily as needed for wheezing or shortness of breath. ) 1 Inhaler 2  . benztropine (COGENTIN) 0.5 MG tablet Take 1 tablet (0.5 mg total) by mouth 2 (two) times daily. 180 tablet 0  . buPROPion (WELLBUTRIN XL) 150 MG 24 hr tablet TAKE ONE TABLET IN THE MORNING X 7 DAYS, THEN TAKE TWO TABLETS IN THE MORNING. 180 tablet 1  . carbamazepine (TEGRETOL) 200 MG tablet Take 2 tablets (400 mg total) by mouth at bedtime. 180 tablet 0  . fluticasone (FLONASE)  50 MCG/ACT nasal spray SPRAY 2 SPRAYS INTO EACH NOSTRIL EVERY DAY 48 mL 1  . montelukast (SINGULAIR) 10 MG tablet TAKE 1 TABLET BY MOUTH EVERYDAY AT BEDTIME 90 tablet 1  . omega-3 acid ethyl esters (LOVAZA) 1 g capsule Take 1 capsule (1 g total) by mouth 2 (two) times daily. 60 capsule 5  . risperiDONE (RISPERDAL) 2 MG tablet Take 1 tablet (2 mg total) by mouth  daily. 30 tablet 2  . risperidone (RISPERDAL) 4 MG tablet TAKE 1 TABLET (4 MG TOTAL) BY MOUTH AT BEDTIME. 90 tablet 0  . sodium chloride HYPERTONIC 3 % nebulizer solution Take by nebulization as needed for other or cough (congestion). 750 mL 12  . temazepam (RESTORIL) 30 MG capsule Take 1 capsule (30 mg total) by mouth at bedtime. 30 capsule 2  . VITAMIN D, CHOLECALCIFEROL, PO Take 1 capsule by mouth daily.      No current facility-administered medications for this visit.    Medication Side Effects: None  Allergies: No Known Allergies  Past Medical History:  Diagnosis Date  . Allergy   . Bipolar 1 disorder (HCC)     Family History  Problem Relation Age of Onset  . Cancer Neg Hx   . Other Neg Hx        low testosterone    Social History   Socioeconomic History  . Marital status: Married    Spouse name: Not on file  . Number of children: Not on file  . Years of education: Not on file  . Highest education level: Not on file  Occupational History  . Not on file  Tobacco Use  . Smoking status: Current Every Day Smoker    Packs/day: 0.50    Types: Cigarettes, E-cigarettes  . Smokeless tobacco: Never Used  Substance and Sexual Activity  . Alcohol use: Yes  . Drug use: Not on file  . Sexual activity: Not on file  Other Topics Concern  . Not on file  Social History Narrative  . Not on file   Social Determinants of Health   Financial Resource Strain:   . Difficulty of Paying Living Expenses:   Food Insecurity:   . Worried About Programme researcher, broadcasting/film/video in the Last Year:   . Barista in the Last Year:   Transportation Needs:   . Freight forwarder (Medical):   Marland Kitchen Lack of Transportation (Non-Medical):   Physical Activity:   . Days of Exercise per Week:   . Minutes of Exercise per Session:   Stress:   . Feeling of Stress :   Social Connections:   . Frequency of Communication with Friends and Family:   . Frequency of Social Gatherings with Friends and Family:    . Attends Religious Services:   . Active Member of Clubs or Organizations:   . Attends Banker Meetings:   Marland Kitchen Marital Status:   Intimate Partner Violence:   . Fear of Current or Ex-Partner:   . Emotionally Abused:   Marland Kitchen Physically Abused:   . Sexually Abused:     Past Medical History, Surgical history, Social history, and Family history were reviewed and updated as appropriate.   Please see review of systems for further details on the patient's review from today.   Objective:   Physical Exam:  There were no vitals taken for this visit.  Physical Exam Constitutional:      General: He is not in acute distress. Musculoskeletal:  General: No deformity.  Neurological:     Mental Status: He is alert and oriented to person, place, and time.     Coordination: Coordination normal.  Psychiatric:        Attention and Perception: Attention and perception normal. He does not perceive auditory or visual hallucinations.        Mood and Affect: Mood normal. Mood is not anxious or depressed. Affect is not labile, blunt, angry or inappropriate.        Speech: Speech normal.        Behavior: Behavior normal.        Thought Content: Thought content normal. Thought content is not paranoid or delusional. Thought content does not include homicidal or suicidal ideation. Thought content does not include homicidal or suicidal plan.        Cognition and Memory: Cognition and memory normal.        Judgment: Judgment normal.     Comments: Insight intact    Lab Review:     Component Value Date/Time   NA 141 04/26/2018 1021   K 3.9 04/26/2018 1021   CL 105 04/26/2018 1021   CO2 28 04/26/2018 1021   GLUCOSE 98 04/26/2018 1021   BUN 8 04/26/2018 1021   CREATININE 0.72 04/26/2018 1021   CALCIUM 9.1 04/26/2018 1021   PROT 7.9 04/26/2018 1021   ALBUMIN 4.6 04/26/2018 1021   AST 31 04/26/2018 1021   ALT 44 04/26/2018 1021   ALKPHOS 93 04/26/2018 1021   BILITOT 0.6 04/26/2018 1021    GFRNONAA >60 04/26/2018 1021   GFRAA >60 04/26/2018 1021       Component Value Date/Time   WBC 8.9 04/26/2018 1021   RBC 5.36 04/26/2018 1021   HGB 16.6 04/26/2018 1021   HCT 50.7 04/26/2018 1021   PLT 216 04/26/2018 1021   MCV 94.6 04/26/2018 1021   MCH 31.0 04/26/2018 1021   MCHC 32.7 04/26/2018 1021   RDW 12.6 04/26/2018 1021   LYMPHSABS 1.4 04/26/2018 1021   MONOABS 0.7 04/26/2018 1021   EOSABS 0.1 04/26/2018 1021   BASOSABS 0.0 04/26/2018 1021    No results found for: POCLITH, LITHIUM   No results found for: PHENYTOIN, PHENOBARB, VALPROATE, CBMZ   .res Assessment: Plan:    Plan:  1. Cogentin 0.5mg  BID 2. Temazepam 30mg  at hs 3. Carbamazepine 200mg  BID 4. Risperdal 4mg  at bedtime - consider reducing to 3mg  next visit 5. Wellbutrin XL 300mg    Consider therapy - CBT  Consider Lamictal   Patient reports compliance with medications.  RTC 6 weeks  Patient advised to contact office with any questions, adverse effects, or acute worsening in signs and symptoms.  Discussed potential metabolic side effects associated with atypical antipsychotics, as well as potential risk for movement side effects. Advised pt to contact office if movement side effects occur.   Discussed potential benefits, risk, and side effects of benzodiazepines to include potential risk of tolerance and dependence, as well as possible drowsiness.  Advised patient not to drive if experiencing drowsiness and to take lowest possible effective dose to minimize risk of dependence and tolerance.  Diagnoses and all orders for this visit:  Generalized anxiety disorder  Major depressive disorder, recurrent episode, moderate (HCC)  PTSD (post-traumatic stress disorder)  Bipolar I disorder (HCC)  Insomnia, unspecified type    Please see After Visit Summary for patient specific instructions.  No future appointments.  No orders of the defined types were placed in this encounter.      -------------------------------

## 2019-06-01 ENCOUNTER — Other Ambulatory Visit: Payer: Self-pay

## 2019-06-02 ENCOUNTER — Encounter: Payer: Self-pay | Admitting: Family Medicine

## 2019-06-02 ENCOUNTER — Ambulatory Visit (INDEPENDENT_AMBULATORY_CARE_PROVIDER_SITE_OTHER): Payer: Federal, State, Local not specified - PPO | Admitting: Family Medicine

## 2019-06-02 VITALS — BP 102/68 | HR 82 | Temp 95.7°F | Ht 75.0 in | Wt 170.5 lb

## 2019-06-02 DIAGNOSIS — R7989 Other specified abnormal findings of blood chemistry: Secondary | ICD-10-CM

## 2019-06-02 DIAGNOSIS — Z0000 Encounter for general adult medical examination without abnormal findings: Secondary | ICD-10-CM | POA: Diagnosis not present

## 2019-06-02 DIAGNOSIS — R634 Abnormal weight loss: Secondary | ICD-10-CM | POA: Diagnosis not present

## 2019-06-02 LAB — LIPID PANEL
Cholesterol: 180 mg/dL (ref 0–200)
HDL: 43.8 mg/dL (ref >39.00)
LDL Cholesterol: 123 mg/dL — ABNORMAL HIGH (ref 0–99)
NonHDL: 136.58
Total CHOL/HDL Ratio: 4
Triglycerides: 69 mg/dL (ref 0.0–149.0)
VLDL: 13.8 mg/dL (ref 0.0–40.0)

## 2019-06-02 LAB — CBC
HCT: 43.9 % (ref 39.0–52.0)
Hemoglobin: 15 g/dL (ref 13.0–17.0)
MCHC: 34.2 g/dL (ref 30.0–36.0)
MCV: 94.1 fl (ref 78.0–100.0)
Platelets: 146 10*3/uL — ABNORMAL LOW (ref 150.0–400.0)
RBC: 4.67 Mil/uL (ref 4.22–5.81)
RDW: 12.2 % (ref 11.5–15.5)
WBC: 4.1 10*3/uL (ref 4.0–10.5)

## 2019-06-02 LAB — COMPREHENSIVE METABOLIC PANEL
ALT: 17 U/L (ref 0–53)
AST: 14 U/L (ref 0–37)
Albumin: 4.7 g/dL (ref 3.5–5.2)
Alkaline Phosphatase: 99 U/L (ref 39–117)
BUN: 10 mg/dL (ref 6–23)
CO2: 31 mEq/L (ref 19–32)
Calcium: 9.1 mg/dL (ref 8.4–10.5)
Chloride: 103 mEq/L (ref 96–112)
Creatinine, Ser: 0.85 mg/dL (ref 0.40–1.50)
GFR: 98.85 mL/min (ref >60.00)
Glucose, Bld: 89 mg/dL (ref 70–99)
Potassium: 4.1 mEq/L (ref 3.5–5.1)
Sodium: 140 mEq/L (ref 135–145)
Total Bilirubin: 0.5 mg/dL (ref 0.2–1.2)
Total Protein: 6.8 g/dL (ref 6.0–8.3)

## 2019-06-02 LAB — T4, FREE: Free T4: 0.66 ng/dL (ref 0.60–1.60)

## 2019-06-02 LAB — TSH: TSH: 1.59 u[IU]/mL (ref 0.35–4.50)

## 2019-06-02 LAB — HEMOGLOBIN A1C: Hgb A1c MFr Bld: 4.9 % (ref 4.6–6.5)

## 2019-06-02 NOTE — Progress Notes (Signed)
Chief Complaint  Patient presents with  . Annual Exam  . Weight Loss    Well Male Christian Baldwin is here for a complete physical.   His last physical was >1 year ago.  Current diet: in general, a "healthy" diet.   Current exercise: walking Weight trend: Hast lost 30 lbs Daytime fatigue? No. Seat belt? Yes.    Health maintenance Tetanus- Yes HIV- Yes  Past Medical History:  Diagnosis Date  . Allergy   . Bipolar 1 disorder [facility])      Past Surgical History:  Procedure Laterality Date  . HERNIA REPAIR      Medications  Current Outpatient Medications on File Prior to Visit  Medication Sig Dispense Refill  . benztropine (COGENTIN) 0.5 MG tablet Take 1 tablet (0.5 mg total) by mouth 2 (two) times daily. 180 tablet 0  . buPROPion (WELLBUTRIN XL) 150 MG 24 hr tablet TAKE ONE TABLET IN THE MORNING X 7 DAYS, THEN TAKE TWO TABLETS IN THE MORNING. 180 tablet 1  . carbamazepine (TEGRETOL) 200 MG tablet Take 2 tablets (400 mg total) by mouth at bedtime. 180 tablet 0  . fluticasone (FLONASE) 50 MCG/ACT nasal spray SPRAY 2 SPRAYS INTO EACH NOSTRIL EVERY DAY 48 mL 1  . montelukast (SINGULAIR) 10 MG tablet TAKE 1 TABLET BY MOUTH EVERYDAY AT BEDTIME 90 tablet 1  . omega-3 acid ethyl esters (LOVAZA) 1 g capsule Take 1 capsule (1 g total) by mouth 2 (two) times daily. 60 capsule 5  . risperidone (RISPERDAL) 4 MG tablet TAKE 1 TABLET (4 MG TOTAL) BY MOUTH AT BEDTIME. 90 tablet 0  . temazepam (RESTORIL) 30 MG capsule Take 1 capsule (30 mg total) by mouth at bedtime. 30 capsule 2  . VITAMIN D, CHOLECALCIFEROL, PO Take 1 capsule by mouth daily.     . sodium chloride HYPERTONIC 3 % nebulizer solution Take by nebulization as needed for other or cough (congestion). (Patient not taking: Reported on 06/02/2019) 750 mL 12    Allergies No Known Allergies  Family History Family History  Problem Relation Age of Onset  . Cancer Neg Hx   . Other Neg Hx        low testosterone    Review of  Systems: Constitutional: no fevers or chills Eye:  no recent significant change in vision Ear/Nose/Mouth/Throat:  Ears:  no hearing loss Nose/Mouth/Throat:  no complaints of nasal congestion, no sore throat Cardiovascular:  no chest pain Respiratory:  no shortness of breath Gastrointestinal:  +constipation GU:  Male: negative for dysuria, frequency, and incontinence Musculoskeletal/Extremities:  no pain of the joints Integumentary (Skin/Breast):  no abnormal skin lesions reported Neurologic:  no headaches Endocrine:+wt loss Hematologic/Lymphatic:  no night sweats  Exam BP 102/68 (BP Location: Left Arm, Patient Position: Sitting, Cuff Size: Normal)   Pulse 82   Temp (!) 95.7 F (35.4 C) (Temporal)   Ht 6\' 3"  (1.905 m)   Wt 170 lb 8 oz (77.3 kg)   SpO2 96%   BMI 21.31 kg/m  General:  well developed, well nourished, in no apparent distress Skin:  no significant moles, warts, or growths Head:  no masses, lesions, or tenderness Eyes:  pupils equal and round, sclera anicteric without injection Ears:  canals without lesions, TMs shiny without retraction, no obvious effusion, no erythema Nose:  nares patent, septum midline, mucosa normal Throat/Pharynx:  lips and gingiva without lesion; tongue and uvula midline; non-inflamed pharynx; no exudates or postnasal drainage Neck: neck supple without adenopathy, thyromegaly, or masses Lungs:  clear to auscultation, breath sounds equal bilaterally, no respiratory distress Cardio:  regular rate and rhythm, no bruits, no LE edema Abdomen:  abdomen soft, nontender; bowel sounds normal; no masses or organomegaly Rectal: Deferred Musculoskeletal:  symmetrical muscle groups noted without atrophy or deformity Extremities:  no clubbing, cyanosis, or edema, no deformities, no skin discoloration Neuro:  gait normal; deep tendon reflexes normal and symmetric Psych: well oriented with normal range of affect and appropriate judgment/insight  Assessment  and Plan  Well adult exam - Plan: CBC, Comprehensive metabolic panel, Lipid panel  Weight loss - Plan: Hemoglobin A1c, TSH, T4, free   Well 44 y.o. male. Counseled on diet and exercise. Counseled on risks and benefits of prostate cancer screening with PSA. The patient agrees to forego screening.  If he continues to have issues with constipation, he will let me know and we will refer to GI. Other orders as above. Follow up in 6 mo pending the above workup. The patient voiced understanding and agreement to the plan.  [place], DO 06/02/19 9:19 AM

## 2019-06-02 NOTE — Patient Instructions (Addendum)
Try to drink around 60-65 oz of water daily.  Give [place] 2-3 business days to get the results of your labs back.   Keep the diet clean and stay active. I want you to start lifting weights or doing weight resistance exercise.   Consider Metamucil for your bowel movements. Make sure you drink lots of water.   Let us know if you need anything.

## 2019-06-13 ENCOUNTER — Telehealth (INDEPENDENT_AMBULATORY_CARE_PROVIDER_SITE_OTHER): Payer: Federal, State, Local not specified - PPO | Admitting: Adult Health

## 2019-06-13 DIAGNOSIS — F4310 Post-traumatic stress disorder, unspecified: Secondary | ICD-10-CM | POA: Diagnosis not present

## 2019-06-13 DIAGNOSIS — F319 Bipolar disorder, unspecified: Secondary | ICD-10-CM | POA: Diagnosis not present

## 2019-06-13 DIAGNOSIS — F331 Major depressive disorder, recurrent, moderate: Secondary | ICD-10-CM

## 2019-06-13 DIAGNOSIS — F411 Generalized anxiety disorder: Secondary | ICD-10-CM | POA: Diagnosis not present

## 2019-06-13 DIAGNOSIS — G4700 Insomnia, unspecified: Secondary | ICD-10-CM

## 2019-06-13 NOTE — Progress Notes (Signed)
Christian Baldwin [id number] 10/19/1975 44 y.o.  Virtual Visit via Telephone Note  I connected with pt on 06/13/19 at  3:20 PM EDT by telephone and verified that I am speaking with the correct person using two identifiers.   I discussed the limitations, risks, security and privacy concerns of performing an evaluation and management service by telephone and the availability of in person appointments. I also discussed with the patient that there may be a patient responsible charge related to this service. The patient expressed understanding and agreed to proceed.   I discussed the assessment and treatment plan with the patient. The patient was provided an opportunity to ask questions and all were answered. The patient agreed with the plan and demonstrated an understanding of the instructions.   The patient was advised to call back or seek an in-person evaluation if the symptoms worsen or if the condition fails to improve as anticipated.  I provided 30 minutes of non-face-to-face time during this encounter.  The patient was located at home.  The provider was located at [facility].   Dorothyann Gibbs, NP   Subjective:   Patient ID:  Christian Baldwin is a 44 y.o. (DOB [birth year]) male.  Chief Complaint: No chief complaint on file.   HPI Christian Baldwin presents for follow-up of GAD, MDD, insomnia, BPD-1, and PTSD  Describes mood today as "ok". Pleasant. Mood symptoms - feels anxious at times. Reports some depression. Denies irritability. Stating "I still feel like I'm at 85%". Took a few days off last week to celebrate his birthday. Recent vacation with family. Saw PCP for physical - increased "bad" cholesterol, otherwise all WNL.Improved interest and motivation. Taking medications as prescribed.  Energy levels "kinda low". Active, does not have a regular exercise routine. Works 40 hours a week. Enjoys some usual interests and activities. Married. Lives with wife of 17  years and 2 children. Spending time with family.  Appetite adequate. Weight stable - 168 - weight gain 3 pounds. Sleeping well most nights. Averages 8 hours.  Focus and concentration mostly stable. Gets distracted at times - 100% related to work. Completing tasks. Managing aspects of household. Work going well. Denies SI or HI. Denies AH or VH.   Spoke with wife - she feels like he continues to make progress. Notes he has been more sentimental lately.   Past Psychiatric History: Admitted in March 2020 for mania after starting Zoloft.  Review of Systems:  Review of Systems  Musculoskeletal: Negative for gait problem.  Neurological: Negative for tremors.  Psychiatric/Behavioral:       Please refer to HPI    Medications: I have reviewed the patient's current medications.  Current Outpatient Medications  Medication Sig Dispense Refill  . benztropine (COGENTIN) 0.5 MG tablet Take 1 tablet (0.5 mg total) by mouth 2 (two) times daily. 180 tablet 0  . buPROPion (WELLBUTRIN XL) 150 MG 24 hr tablet TAKE ONE TABLET IN THE MORNING X 7 DAYS, THEN TAKE TWO TABLETS IN THE MORNING. 180 tablet 1  . carbamazepine (TEGRETOL) 200 MG tablet Take 2 tablets (400 mg total) by mouth at bedtime. 180 tablet 0  . fluticasone (FLONASE) 50 MCG/ACT nasal spray SPRAY 2 SPRAYS INTO EACH NOSTRIL EVERY DAY 48 mL 1  . montelukast (SINGULAIR) 10 MG tablet TAKE 1 TABLET BY MOUTH EVERYDAY AT BEDTIME 90 tablet 1  . omega-3 acid ethyl esters (LOVAZA) 1 g capsule Take 1 capsule (1 g total) by mouth 2 (two) times daily. 60 capsule 5  .  risperidone (RISPERDAL) 4 MG tablet TAKE 1 TABLET (4 MG TOTAL) BY MOUTH AT BEDTIME. 90 tablet 0  . sodium chloride HYPERTONIC 3 % nebulizer solution Take by nebulization as needed for other or cough (congestion). (Patient not taking: Reported on 06/02/2019) 750 mL 12  . temazepam (RESTORIL) 30 MG capsule Take 1 capsule (30 mg total) by mouth at bedtime. 30 capsule 2  . VITAMIN D, CHOLECALCIFEROL,  PO Take 1 capsule by mouth daily.      No current facility-administered medications for this visit.    Medication Side Effects: None  Allergies: No Known Allergies  Past Medical History:  Diagnosis Date  . Allergy   . Bipolar 1 disorder (HCC)     Family History  Problem Relation Age of Onset  . Cancer Neg Hx   . Other Neg Hx        low testosterone    Social History   Socioeconomic History  . Marital status: Married    Spouse name: Not on file  . Number of children: Not on file  . Years of education: Not on file  . Highest education level: Not on file  Occupational History  . Not on file  Tobacco Use  . Smoking status: Current Every Day Smoker    Packs/day: 0.50    Types: Cigarettes, E-cigarettes  . Smokeless tobacco: Never Used  Substance and Sexual Activity  . Alcohol use: Yes  . Drug use: Not on file  . Sexual activity: Not on file  Other Topics Concern  . Not on file  Social History Narrative  . Not on file   Social Determinants of Health   Financial Resource Strain:   . Difficulty of Paying Living Expenses:   Food Insecurity:   . Worried About Programme researcher, broadcasting/film/video in the Last Year:   . Barista in the Last Year:   Transportation Needs:   . Freight forwarder (Medical):   Marland Kitchen Lack of Transportation (Non-Medical):   Physical Activity:   . Days of Exercise per Week:   . Minutes of Exercise per Session:   Stress:   . Feeling of Stress :   Social Connections:   . Frequency of Communication with Friends and Family:   . Frequency of Social Gatherings with Friends and Family:   . Attends Religious Services:   . Active Member of Clubs or Organizations:   . Attends Banker Meetings:   Marland Kitchen Marital Status:   Intimate Partner Violence:   . Fear of Current or Ex-Partner:   . Emotionally Abused:   Marland Kitchen Physically Abused:   . Sexually Abused:     Past Medical History, Surgical history, Social history, and Family history were reviewed and  updated as appropriate.   Please see review of systems for further details on the patient's review from today.   Objective:   Physical Exam:  There were no vitals taken for this visit.  Physical Exam Constitutional:      General: He is not in acute distress. Musculoskeletal:        General: No deformity.  Neurological:     Mental Status: He is alert and oriented to person, place, and time.     Coordination: Coordination normal.  Psychiatric:        Attention and Perception: Attention and perception normal. He does not perceive auditory or visual hallucinations.        Mood and Affect: Mood is anxious. Mood is not depressed. Affect is  not labile, blunt, angry or inappropriate.        Speech: Speech normal.        Behavior: Behavior normal.        Thought Content: Thought content normal. Thought content is not paranoid or delusional. Thought content does not include homicidal or suicidal ideation. Thought content does not include homicidal or suicidal plan.        Cognition and Memory: Cognition and memory normal.        Judgment: Judgment normal.     Comments: Insight intact     Lab Review:     Component Value Date/Time   NA 140 06/02/2019 0904   K 4.1 06/02/2019 0904   CL 103 06/02/2019 0904   CO2 31 06/02/2019 0904   GLUCOSE 89 06/02/2019 0904   BUN 10 06/02/2019 0904   CREATININE 0.85 06/02/2019 0904   CALCIUM 9.1 06/02/2019 0904   PROT 6.8 06/02/2019 0904   ALBUMIN 4.7 06/02/2019 0904   AST 14 06/02/2019 0904   ALT 17 06/02/2019 0904   ALKPHOS 99 06/02/2019 0904   BILITOT 0.5 06/02/2019 0904   GFRNONAA >60 04/26/2018 1021   GFRAA >60 04/26/2018 1021       Component Value Date/Time   WBC 4.1 06/02/2019 0904   RBC 4.67 06/02/2019 0904   HGB 15.0 06/02/2019 0904   HCT 43.9 06/02/2019 0904   PLT 146.0 (L) 06/02/2019 0904   MCV 94.1 06/02/2019 0904   MCH 31.0 04/26/2018 1021   MCHC 34.2 06/02/2019 0904   RDW 12.2 06/02/2019 0904   LYMPHSABS 1.4 04/26/2018  1021   MONOABS 0.7 04/26/2018 1021   EOSABS 0.1 04/26/2018 1021   BASOSABS 0.0 04/26/2018 1021    No results found for: POCLITH, LITHIUM   No results found for: PHENYTOIN, PHENOBARB, VALPROATE, CBMZ   .res Assessment: Plan:    Plan:  1. Cogentin 0.5mg  BID 2. Temazepam 30mg  at hs 3. Carbamazepine 200mg  BID 4. Risperdal 4mg  at bedtime - consider reducing to 3mg  next visit 5. Wellbutrin XL 300mg    Consider therapy - CBT  Consider increase in Wellbutrin XL Consider Lamictal   Patient reports compliance with medications.  RTC 6 weeks  Patient advised to contact office with any questions, adverse effects, or acute worsening in signs and symptoms.  Discussed potential metabolic side effects associated with atypical antipsychotics, as well as potential risk for movement side effects. Advised pt to contact office if movement side effects occur.   Discussed potential benefits, risk, and side effects of benzodiazepines to include potential risk of tolerance and dependence, as well as possible drowsiness.  Advised patient not to drive if experiencing drowsiness and to take lowest possible effective dose to minimize risk of dependence and tolerance.   There are no diagnoses linked to this encounter.  Please see After Visit Summary for patient specific instructions.  No future appointments.  No orders of the defined types were placed in this encounter.     -------------------------------

## 2019-06-14 ENCOUNTER — Other Ambulatory Visit: Payer: Self-pay | Admitting: Adult Health

## 2019-06-14 DIAGNOSIS — Z79899 Other long term (current) drug therapy: Secondary | ICD-10-CM

## 2019-06-14 DIAGNOSIS — F319 Bipolar disorder, unspecified: Secondary | ICD-10-CM

## 2019-07-24 ENCOUNTER — Other Ambulatory Visit: Payer: Self-pay | Admitting: Adult Health

## 2019-07-24 DIAGNOSIS — G4700 Insomnia, unspecified: Secondary | ICD-10-CM

## 2019-08-01 ENCOUNTER — Telehealth (INDEPENDENT_AMBULATORY_CARE_PROVIDER_SITE_OTHER): Payer: Federal, State, Local not specified - PPO | Admitting: Adult Health

## 2019-08-01 ENCOUNTER — Encounter: Payer: Self-pay | Admitting: Adult Health

## 2019-08-01 DIAGNOSIS — F411 Generalized anxiety disorder: Secondary | ICD-10-CM | POA: Diagnosis not present

## 2019-08-01 DIAGNOSIS — F331 Major depressive disorder, recurrent, moderate: Secondary | ICD-10-CM | POA: Diagnosis not present

## 2019-08-01 DIAGNOSIS — F319 Bipolar disorder, unspecified: Secondary | ICD-10-CM | POA: Diagnosis not present

## 2019-08-01 DIAGNOSIS — G4700 Insomnia, unspecified: Secondary | ICD-10-CM | POA: Diagnosis not present

## 2019-08-01 DIAGNOSIS — Z79899 Other long term (current) drug therapy: Secondary | ICD-10-CM

## 2019-08-01 MED ORDER — BENZTROPINE MESYLATE 0.5 MG PO TABS: 0.5000 mg | ORAL_TABLET | Freq: Two times a day (BID) | ORAL | 1 refills | Status: DC

## 2019-08-01 MED ORDER — BUPROPION HCL ER (XL) 150 MG PO TB24: ORAL_TABLET | ORAL | 1 refills | Status: DC

## 2019-08-01 MED ORDER — CARBAMAZEPINE 200 MG PO TABS: 400.0000 mg | ORAL_TABLET | Freq: Every day | ORAL | 1 refills | Status: DC

## 2019-08-01 MED ORDER — RISPERIDONE 3 MG PO TABS
3.0000 mg | ORAL_TABLET | Freq: Every day | ORAL | 1 refills | Status: DC
Start: 2019-08-01 — End: 2019-09-05

## 2019-08-01 NOTE — Progress Notes (Signed)
Christian Baldwin [id number] Mar 24, 1975 44 y.o.  Virtual Visit via Telephone Note  I connected with pt on 08/01/19 at  3:20 PM EDT by telephone and verified that I am speaking with the correct person using two identifiers.   I discussed the limitations, risks, security and privacy concerns of performing an evaluation and management service by telephone and the availability of in person appointments. I also discussed with the patient that there may be a patient responsible charge related to this service. The patient expressed understanding and agreed to proceed.   I discussed the assessment and treatment plan with the patient. The patient was provided an opportunity to ask questions and all were answered. The patient agreed with the plan and demonstrated an understanding of the instructions.   The patient was advised to call back or seek an in-person evaluation if the symptoms worsen or if the condition fails to improve as anticipated.  I provided 30 minutes of non-face-to-face time during this encounter.  The patient was located at home.  The provider was located at [facility].   Dorothyann Gibbs, NP   Subjective:   Patient ID:  Christian Baldwin is a 44 y.o. (DOB Oct 27, 1975) male.  Chief Complaint: No chief complaint on file.   HPI Christian Baldwin presents for follow-up of GAD, MDD, insomnia, BPD-1, and PTSD  Describes mood today as "ok". Pleasant. Mood symptoms - feels anxious - "mostly in the work setting". Reports "a little" depression. Denies irritability. Stating mood is "good and steady". Wanting to try and decrease dose of Risperdal from 4mg  to 3mg  daily. Improved interest and motivation. Taking medications as prescribed.  Energy levels "low". Getting things done he needs to get done. Active, does not have a regular exercise routine. Walking dog. Works 40 hours a week. Enjoys some usual interests and activities. Married. Lives with wife of 17 years and 2  children. Spending time with family. Planning a trip to [place] - art exhibit and [place].  Appetite adequate. Weight loss - 5 pounds -163 pounds.  Sleeping well most nights. Averages 8 hours.  Focus and concentration stable. Completing tasks. Managing aspects of household. Work going well. Denies SI or HI. Denies AH or VH.   Spoke with wife - she feels like he is stable. Gets emotionally sentimental. Some worry and rumination.  Past Psychiatric History: Admitted in March 2020 for mania after starting Zoloft.  Review of Systems:  Review of Systems  Musculoskeletal: Negative for gait problem.  Neurological: Negative for tremors.  Psychiatric/Behavioral:       Please refer to HPI    Medications: I have reviewed the patient's current medications.  Current Outpatient Medications  Medication Sig Dispense Refill  . benztropine (COGENTIN) 0.5 MG tablet TAKE 1 TABLET BY MOUTH 2 TIMES DAILY. 180 tablet 0  . buPROPion (WELLBUTRIN XL) 150 MG 24 hr tablet TAKE ONE TABLET IN THE MORNING X 7 DAYS, THEN TAKE TWO TABLETS IN THE MORNING. 180 tablet 1  . carbamazepine (TEGRETOL) 200 MG tablet TAKE 2 TABLETS (400 MG TOTAL) BY MOUTH AT BEDTIME. 180 tablet 0  . fluticasone (FLONASE) 50 MCG/ACT nasal spray SPRAY 2 SPRAYS INTO EACH NOSTRIL EVERY DAY 48 mL 1  . montelukast (SINGULAIR) 10 MG tablet TAKE 1 TABLET BY MOUTH EVERYDAY AT BEDTIME 90 tablet 1  . omega-3 acid ethyl esters (LOVAZA) 1 g capsule Take 1 capsule (1 g total) by mouth 2 (two) times daily. 60 capsule 5  . risperidone (RISPERDAL) 4 MG tablet TAKE 1 TABLET (  4 MG TOTAL) BY MOUTH AT BEDTIME. 90 tablet 0  . sodium chloride HYPERTONIC 3 % nebulizer solution Take by nebulization as needed for other or cough (congestion). (Patient not taking: Reported on 06/02/2019) 750 mL 12  . temazepam (RESTORIL) 30 MG capsule TAKE 1 CAPSULE (30 MG TOTAL) BY MOUTH AT BEDTIME. 30 capsule 2  . VITAMIN D, CHOLECALCIFEROL, PO Take 1 capsule by mouth daily.       No current facility-administered medications for this visit.    Medication Side Effects: None  Allergies: No Known Allergies  Past Medical History:  Diagnosis Date  . Allergy   . Bipolar 1 disorder ([place])     Family History  Problem Relation Age of Onset  . Cancer Neg Hx   . Other Neg Hx        low testosterone    Social History   Socioeconomic History  . Marital status: Married    Spouse name: Not on file  . Number of children: Not on file  . Years of education: Not on file  . Highest education level: Not on file  Occupational History  . Not on file  Tobacco Use  . Smoking status: Current Every Day Smoker    Packs/day: 0.50    Types: Cigarettes, E-cigarettes  . Smokeless tobacco: Never Used  Substance and Sexual Activity  . Alcohol use: Yes  . Drug use: Not on file  . Sexual activity: Not on file  Other Topics Concern  . Not on file  Social History Narrative  . Not on file   Social Determinants of Health   Financial Resource Strain:   . Difficulty of Paying Living Expenses:   Food Insecurity:   . Worried About Charity fundraiser in the Last Year:   . Arboriculturist in the Last Year:   Transportation Needs:   . Film/video editor (Medical):   Marland Kitchen Lack of Transportation (Non-Medical):   Physical Activity:   . Days of Exercise per Week:   . Minutes of Exercise per Session:   Stress:   . Feeling of Stress :   Social Connections:   . Frequency of Communication with Friends and Family:   . Frequency of Social Gatherings with Friends and Family:   . Attends Religious Services:   . Active Member of Clubs or Organizations:   . Attends Archivist Meetings:   Marland Kitchen Marital Status:   Intimate Partner Violence:   . Fear of Current or Ex-Partner:   . Emotionally Abused:   Marland Kitchen Physically Abused:   . Sexually Abused:     Past Medical History, Surgical history, Social history, and Family history were reviewed and updated as appropriate.   Please  see review of systems for further details on the patient's review from today.   Objective:   Physical Exam:  There were no vitals taken for this visit.  Physical Exam Constitutional:      General: He is not in acute distress. Musculoskeletal:        General: No deformity.  Neurological:     Mental Status: He is alert and oriented to person, place, and time.     Coordination: Coordination normal.  Psychiatric:        Attention and Perception: Attention and perception normal. He does not perceive auditory or visual hallucinations.        Mood and Affect: Mood normal. Mood is not anxious or depressed. Affect is not labile, blunt, angry or inappropriate.  Speech: Speech normal.        Behavior: Behavior normal.        Thought Content: Thought content normal. Thought content is not paranoid or delusional. Thought content does not include homicidal or suicidal ideation. Thought content does not include homicidal or suicidal plan.        Cognition and Memory: Cognition and memory normal.        Judgment: Judgment normal.     Comments: Insight intact     Lab Review:     Component Value Date/Time   NA 140 06/02/2019 0904   K 4.1 06/02/2019 0904   CL 103 06/02/2019 0904   CO2 31 06/02/2019 0904   GLUCOSE 89 06/02/2019 0904   BUN 10 06/02/2019 0904   CREATININE 0.85 06/02/2019 0904   CALCIUM 9.1 06/02/2019 0904   PROT 6.8 06/02/2019 0904   ALBUMIN 4.7 06/02/2019 0904   AST 14 06/02/2019 0904   ALT 17 06/02/2019 0904   ALKPHOS 99 06/02/2019 0904   BILITOT 0.5 06/02/2019 0904   GFRNONAA >60 04/26/2018 1021   GFRAA >60 04/26/2018 1021       Component Value Date/Time   WBC 4.1 06/02/2019 0904   RBC 4.67 06/02/2019 0904   HGB 15.0 06/02/2019 0904   HCT 43.9 06/02/2019 0904   PLT 146.0 (L) 06/02/2019 0904   MCV 94.1 06/02/2019 0904   MCH 31.0 04/26/2018 1021   MCHC 34.2 06/02/2019 0904   RDW 12.2 06/02/2019 0904   LYMPHSABS 1.4 04/26/2018 1021   MONOABS 0.7 04/26/2018  1021   EOSABS 0.1 04/26/2018 1021   BASOSABS 0.0 04/26/2018 1021    No results found for: POCLITH, LITHIUM   No results found for: PHENYTOIN, PHENOBARB, VALPROATE, CBMZ   .res Assessment: Plan:    Plan:  1. Cogentin 0.5mg  BID 2. Temazepam 30mg  at hs 3. Carbamazepine 200mg  BID 4. Decrease Risperdal 4mg  to 3mg  at bedtime  5. Wellbutrin XL 150mg  - 2 every morning  RTC 4 weeks  Patient advised to contact office with any questions, adverse effects, or acute worsening in signs and symptoms.  Discussed potential metabolic side effects associated with atypical antipsychotics, as well as potential risk for movement side effects. Advised pt to contact office if movement side effects occur.   Discussed potential benefits, risk, and side effects of benzodiazepines to include potential risk of tolerance and dependence, as well as possible drowsiness.  Advised patient not to drive if experiencing drowsiness and to take lowest possible effective dose to minimize risk of dependence and tolerance.  There are no diagnoses linked to this encounter.  Please see After Visit Summary for patient specific instructions.  No future appointments.  No orders of the defined types were placed in this encounter.     -------------------------------

## 2019-09-05 ENCOUNTER — Telehealth: Payer: Self-pay | Admitting: Adult Health

## 2019-09-05 ENCOUNTER — Encounter: Payer: Self-pay | Admitting: Adult Health

## 2019-09-05 ENCOUNTER — Telehealth (INDEPENDENT_AMBULATORY_CARE_PROVIDER_SITE_OTHER): Payer: Federal, State, Local not specified - PPO | Admitting: Adult Health

## 2019-09-05 DIAGNOSIS — F4310 Post-traumatic stress disorder, unspecified: Secondary | ICD-10-CM | POA: Diagnosis not present

## 2019-09-05 DIAGNOSIS — F319 Bipolar disorder, unspecified: Secondary | ICD-10-CM | POA: Diagnosis not present

## 2019-09-05 DIAGNOSIS — G4700 Insomnia, unspecified: Secondary | ICD-10-CM

## 2019-09-05 DIAGNOSIS — F331 Major depressive disorder, recurrent, moderate: Secondary | ICD-10-CM

## 2019-09-05 DIAGNOSIS — F411 Generalized anxiety disorder: Secondary | ICD-10-CM

## 2019-09-05 MED ORDER — RISPERIDONE 2 MG PO TABS: 2.0000 mg | ORAL_TABLET | Freq: Every day | ORAL | 1 refills | Status: DC

## 2019-09-05 NOTE — Telephone Encounter (Signed)
Unable to complete video session.

## 2019-09-05 NOTE — Progress Notes (Signed)
Christian Baldwin [id number] 10/27/1975 44 y.o.  Virtual Visit via Video Note  I connected with pt @ on 09/05/19 at  3:40 PM EDT by a video enabled telemedicine application and verified that I am speaking with the correct person using two identifiers.   I discussed the limitations of evaluation and management by telemedicine and the availability of in person appointments. The patient expressed understanding and agreed to proceed.  I discussed the assessment and treatment plan with the patient. The patient was provided an opportunity to ask questions and all were answered. The patient agreed with the plan and demonstrated an understanding of the instructions.   The patient was advised to call back or seek an in-person evaluation if the symptoms worsen or if the condition fails to improve as anticipated.  I provided 30 minutes of non-face-to-face time during this encounter.  The patient was located at home.  The provider was located at [facility].   Dorothyann Gibbs, NP   Subjective:   Patient ID:  Christian Baldwin is a 44 y.o. (DOB 1975-04-25) male.  Chief Complaint: No chief complaint on file.   HPI Christian Baldwin presents for follow-up of GAD, MDD, insomnia, BPD-1, and PTSD  Describes mood today as "ok". Pleasant. Mood symptoms - feels anxious at work "sometimes". Louann Sjogren is switching him to a different project. Work environment not as stressful. Depression is a 2 to 3 on a scale of 10. Stating "the depression may be from the anxiety". Denies irritability. Has decreased Risperdal from 3mg  to 2mg  at bedtime without any mood instability. Would like to continue to decrease dose as tolerated. Stable interest and motivation. Taking medications as prescribed.  Energy levels "ok". Getting tired in the afternoons. Active, does not have a regular exercise routine. Walking dog.  Enjoys some usual interests and activities. Married. Lives with wife of 17 years and 2 children.  Spending time with family.  Appetite adequate. Weight gain - 4 pounds - 163 pounds.  Sleeping well most nights. Averages 8 hours.  Focus and concentration stable. Completing tasks. Managing aspects of household. Work going well - 40 hours a week.  Denies SI or HI. Denies AH or VH.   Spoke with wife - she feels mood is stable.    Past Psychiatric History: Admitted in March 2020 for mania after starting Zoloft.  Review of Systems:  Review of Systems  Musculoskeletal: Negative for gait problem.  Neurological: Negative for tremors.  Psychiatric/Behavioral:       Please refer to HPI    Medications: I have reviewed the patient's current medications.  Current Outpatient Medications  Medication Sig Dispense Refill   benztropine (COGENTIN) 0.5 MG tablet Take 1 tablet (0.5 mg total) by mouth 2 (two) times daily. 180 tablet 1   buPROPion (WELLBUTRIN XL) 150 MG 24 hr tablet Take two tablets every morning. 180 tablet 1   carbamazepine (TEGRETOL) 200 MG tablet Take 2 tablets (400 mg total) by mouth at bedtime. 180 tablet 1   fluticasone (FLONASE) 50 MCG/ACT nasal spray SPRAY 2 SPRAYS INTO EACH NOSTRIL EVERY DAY 48 mL 1   montelukast (SINGULAIR) 10 MG tablet TAKE 1 TABLET BY MOUTH EVERYDAY AT BEDTIME 90 tablet 1   omega-3 acid ethyl esters (LOVAZA) 1 g capsule Take 1 capsule (1 g total) by mouth 2 (two) times daily. 60 capsule 5   risperiDONE (RISPERDAL) 2 MG tablet Take 1 tablet (2 mg total) by mouth at bedtime. 90 tablet 1   sodium chloride HYPERTONIC 3 %  nebulizer solution Take by nebulization as needed for other or cough (congestion). (Patient not taking: Reported on 06/02/2019) 750 mL 12   temazepam (RESTORIL) 30 MG capsule TAKE 1 CAPSULE (30 MG TOTAL) BY MOUTH AT BEDTIME. 30 capsule 2   VITAMIN D, CHOLECALCIFEROL, PO Take 1 capsule by mouth daily.      No current facility-administered medications for this visit.    Medication Side Effects: None  Allergies: No Known  Allergies  Past Medical History:  Diagnosis Date   Allergy    Bipolar 1 disorder (HCC)     Family History  Problem Relation Age of Onset   Cancer Neg Hx    Other Neg Hx        low testosterone    Social History   Socioeconomic History   Marital status: Married    Spouse name: Not on file   Number of children: Not on file   Years of education: Not on file   Highest education level: Not on file  Occupational History   Not on file  Tobacco Use   Smoking status: Current Every Day Smoker    Packs/day: 0.50    Types: Cigarettes, E-cigarettes   Smokeless tobacco: Never Used  Substance and Sexual Activity   Alcohol use: Yes   Drug use: Not on file   Sexual activity: Not on file  Other Topics Concern   Not on file  Social History Narrative   Not on file   Social Determinants of Health   Financial Resource Strain:    Difficulty of Paying Living Expenses:   Food Insecurity:    Worried About Programme researcher, broadcasting/film/video in the Last Year:    Barista in the Last Year:   Transportation Needs:    Freight forwarder (Medical):    Lack of Transportation (Non-Medical):   Physical Activity:    Days of Exercise per Week:    Minutes of Exercise per Session:   Stress:    Feeling of Stress :   Social Connections:    Frequency of Communication with Friends and Family:    Frequency of Social Gatherings with Friends and Family:    Attends Religious Services:    Active Member of Clubs or Organizations:    Attends Engineer, structural:    Marital Status:   Intimate Partner Violence:    Fear of Current or Ex-Partner:    Emotionally Abused:    Physically Abused:    Sexually Abused:     Past Medical History, Surgical history, Social history, and Family history were reviewed and updated as appropriate.   Please see review of systems for further details on the patient's review from today.   Objective:   Physical Exam:  There were  no vitals taken for this visit.  Physical Exam Constitutional:      General: He is not in acute distress. Musculoskeletal:        General: No deformity.  Neurological:     Mental Status: He is alert and oriented to person, place, and time.     Coordination: Coordination normal.  Psychiatric:        Attention and Perception: Attention and perception normal. He does not perceive auditory or visual hallucinations.        Mood and Affect: Mood normal. Mood is not anxious or depressed. Affect is not labile, blunt, angry or inappropriate.        Speech: Speech normal.        Behavior:  Behavior normal.        Thought Content: Thought content normal. Thought content is not paranoid or delusional. Thought content does not include homicidal or suicidal ideation. Thought content does not include homicidal or suicidal plan.        Cognition and Memory: Cognition and memory normal.        Judgment: Judgment normal.     Comments: Insight intact     Lab Review:     Component Value Date/Time   NA 140 06/02/2019 0904   K 4.1 06/02/2019 0904   CL 103 06/02/2019 0904   CO2 31 06/02/2019 0904   GLUCOSE 89 06/02/2019 0904   BUN 10 06/02/2019 0904   CREATININE 0.85 06/02/2019 0904   CALCIUM 9.1 06/02/2019 0904   PROT 6.8 06/02/2019 0904   ALBUMIN 4.7 06/02/2019 0904   AST 14 06/02/2019 0904   ALT 17 06/02/2019 0904   ALKPHOS 99 06/02/2019 0904   BILITOT 0.5 06/02/2019 0904   GFRNONAA >60 04/26/2018 1021   GFRAA >60 04/26/2018 1021       Component Value Date/Time   WBC 4.1 06/02/2019 0904   RBC 4.67 06/02/2019 0904   HGB 15.0 06/02/2019 0904   HCT 43.9 06/02/2019 0904   PLT 146.0 (L) 06/02/2019 0904   MCV 94.1 06/02/2019 0904   MCH 31.0 04/26/2018 1021   MCHC 34.2 06/02/2019 0904   RDW 12.2 06/02/2019 0904   LYMPHSABS 1.4 04/26/2018 1021   MONOABS 0.7 04/26/2018 1021   EOSABS 0.1 04/26/2018 1021   BASOSABS 0.0 04/26/2018 1021    No results found for: POCLITH, LITHIUM   No  results found for: PHENYTOIN, PHENOBARB, VALPROATE, CBMZ   .res Assessment: Plan:    Plan:  1. Cogentin 0.5mg  BID to daily - increased constipation 2. Temazepam 30mg  at hs 3. Carbamazepine 200mg  BID 4. Decrease Risperdal 3mg  to 2mg  at bedtime  5. Wellbutrin XL 150mg  - 2 every morning  RTC 4/6 weeks  Discussed risks associated with lowering Risperdal. Patient and wife agree to move forward with decreasing the dose. Has tolerated Risperdal from 4mg  to 3mg .   Patient advised to contact office with any questions, adverse effects, or acute worsening in signs and symptoms.  Discussed potential metabolic side effects associated with atypical antipsychotics, as well as potential risk for movement side effects. Advised pt to contact office if movement side effects occur.   Discussed potential benefits, risk, and side effects of benzodiazepines to include potential risk of tolerance and dependence, as well as possible drowsiness.  Advised patient not to drive if experiencing drowsiness and to take lowest possible effective dose to minimize risk of dependence and tolerance.   Diagnoses and all orders for this visit:  PTSD (post-traumatic stress disorder)  Insomnia, unspecified type  Bipolar I disorder (HCC) -     risperiDONE (RISPERDAL) 2 MG tablet; Take 1 tablet (2 mg total) by mouth at bedtime.  Major depressive disorder, recurrent episode, moderate (HCC)  Generalized anxiety disorder     Please see After Visit Summary for patient specific instructions.  Future Appointments  Date Time Provider Department Center  10/09/2019  3:40 PM Dakotah Orrego, , NP [facility] None    No orders of the defined types were placed in this encounter.     -------------------------------

## 2019-10-09 ENCOUNTER — Encounter: Payer: Self-pay | Admitting: Adult Health

## 2019-10-09 ENCOUNTER — Telehealth (INDEPENDENT_AMBULATORY_CARE_PROVIDER_SITE_OTHER): Payer: Federal, State, Local not specified - PPO | Admitting: Adult Health

## 2019-10-09 DIAGNOSIS — F331 Major depressive disorder, recurrent, moderate: Secondary | ICD-10-CM | POA: Diagnosis not present

## 2019-10-09 DIAGNOSIS — F4310 Post-traumatic stress disorder, unspecified: Secondary | ICD-10-CM

## 2019-10-09 DIAGNOSIS — F319 Bipolar disorder, unspecified: Secondary | ICD-10-CM | POA: Diagnosis not present

## 2019-10-09 DIAGNOSIS — G4700 Insomnia, unspecified: Secondary | ICD-10-CM

## 2019-10-09 DIAGNOSIS — F411 Generalized anxiety disorder: Secondary | ICD-10-CM

## 2019-10-09 NOTE — Progress Notes (Signed)
Christian Baldwin [id number] 1975/12/28 44 y.o.  Virtual Visit via Video Note  I connected with pt @ on 10/09/19 at  3:40 PM EDT by a video enabled telemedicine application and verified that I am speaking with the correct person using two identifiers.   I discussed the limitations of evaluation and management by telemedicine and the availability of in person appointments. The patient expressed understanding and agreed to proceed.  I discussed the assessment and treatment plan with the patient. The patient was provided an opportunity to ask questions and all were answered. The patient agreed with the plan and demonstrated an understanding of the instructions.   The patient was advised to call back or seek an in-person evaluation if the symptoms worsen or if the condition fails to improve as anticipated.  I provided 30 minutes of non-face-to-face time during this encounter.  The patient was located at home.  The provider was located at [facility].   Christian Gibbs, NP   Subjective:   Patient ID:  Christian Baldwin is a 44 y.o. (DOB March 30, 1975) male.  Chief Complaint: No chief complaint on file.   HPI Christian Baldwin presents for follow-up of GAD, MDD, insomnia, BPD-1, and PTSD  Describes mood today as "ok". Pleasant. Mood symptoms - feels "a little less" anxious in work setting - working on a long term project. Denies depression and irritability. Has tolerated decrease in Risperdal from 3mg  to 2mg . Wife notes he is "laughing naturally". Also notes more of his personality is coming back. Stating "I feel alright". Improvement with constipation but still a "issue". More interactive with family. Stable interest and motivation. Taking medications as prescribed.  Energy levels "ok". Not coming home and "crashing". Active, does not have a regular exercise routine. Walking dog.  Enjoys some usual interests and activities. Married. Lives with wife of 17 years and 2 children.  Spending time with family.  Appetite adequate. Weight gain - 2 pounds - 165 pounds.  Sleeps well most nights. Averages 8 hours.  Focus and concentration stable. Completing tasks. Managing aspects of household. Work going well - 40 hours a week.  Denies SI or HI. Denies AH or VH.   Spoke with wife - she feels mood is stable.    Past Psychiatric History: Admitted in March 2020 for mania after starting Zoloft.   Review of Systems:  Review of Systems  Musculoskeletal: Negative for gait problem.  Neurological: Negative for tremors.  Psychiatric/Behavioral:       Please refer to HPI    Medications: I have reviewed the patient's current medications.  Current Outpatient Medications  Medication Sig Dispense Refill  . benztropine (COGENTIN) 0.5 MG tablet Take 1 tablet (0.5 mg total) by mouth 2 (two) times daily. 180 tablet 1  . buPROPion (WELLBUTRIN XL) 150 MG 24 hr tablet Take two tablets every morning. 180 tablet 1  . carbamazepine (TEGRETOL) 200 MG tablet Take 2 tablets (400 mg total) by mouth at bedtime. 180 tablet 1  . fluticasone (FLONASE) 50 MCG/ACT nasal spray SPRAY 2 SPRAYS INTO EACH NOSTRIL EVERY DAY 48 mL 1  . montelukast (SINGULAIR) 10 MG tablet TAKE 1 TABLET BY MOUTH EVERYDAY AT BEDTIME 90 tablet 1  . omega-3 acid ethyl esters (LOVAZA) 1 g capsule Take 1 capsule (1 g total) by mouth 2 (two) times daily. 60 capsule 5  . risperiDONE (RISPERDAL) 2 MG tablet Take 1 tablet (2 mg total) by mouth at bedtime. 90 tablet 1  . sodium chloride HYPERTONIC 3 % nebulizer solution  Take by nebulization as needed for other or cough (congestion). (Patient not taking: Reported on 06/02/2019) 750 mL 12  . temazepam (RESTORIL) 30 MG capsule TAKE 1 CAPSULE (30 MG TOTAL) BY MOUTH AT BEDTIME. 30 capsule 2  . VITAMIN D, CHOLECALCIFEROL, PO Take 1 capsule by mouth daily.      No current facility-administered medications for this visit.    Medication Side Effects: None  Allergies: No Known  Allergies  Past Medical History:  Diagnosis Date  . Allergy   . Bipolar 1 disorder (HCC)     Family History  Problem Relation Age of Onset  . Cancer Neg Hx   . Other Neg Hx        low testosterone    Social History   Socioeconomic History  . Marital status: Married    Spouse name: Not on file  . Number of children: Not on file  . Years of education: Not on file  . Highest education level: Not on file  Occupational History  . Not on file  Tobacco Use  . Smoking status: Current Every Day Smoker    Packs/day: 0.50    Types: Cigarettes, E-cigarettes  . Smokeless tobacco: Never Used  Substance and Sexual Activity  . Alcohol use: Yes  . Drug use: Not on file  . Sexual activity: Not on file  Other Topics Concern  . Not on file  Social History Narrative  . Not on file   Social Determinants of Health   Financial Resource Strain:   . Difficulty of Paying Living Expenses: Not on file  Food Insecurity:   . Worried About Programme researcher, broadcasting/film/video in the Last Year: Not on file  . Ran Out of Food in the Last Year: Not on file  Transportation Needs:   . Lack of Transportation (Medical): Not on file  . Lack of Transportation (Non-Medical): Not on file  Physical Activity:   . Days of Exercise per Week: Not on file  . Minutes of Exercise per Session: Not on file  Stress:   . Feeling of Stress : Not on file  Social Connections:   . Frequency of Communication with Friends and Family: Not on file  . Frequency of Social Gatherings with Friends and Family: Not on file  . Attends Religious Services: Not on file  . Active Member of Clubs or Organizations: Not on file  . Attends Banker Meetings: Not on file  . Marital Status: Not on file  Intimate Partner Violence:   . Fear of Current or Ex-Partner: Not on file  . Emotionally Abused: Not on file  . Physically Abused: Not on file  . Sexually Abused: Not on file    Past Medical History, Surgical history, Social history,  and Family history were reviewed and updated as appropriate.   Please see review of systems for further details on the patient's review from today.   Objective:   Physical Exam:  There were no vitals taken for this visit.  Physical Exam Constitutional:      General: He is not in acute distress. Musculoskeletal:        General: No deformity.  Neurological:     Mental Status: He is alert and oriented to person, place, and time.     Coordination: Coordination normal.  Psychiatric:        Attention and Perception: Attention and perception normal. He does not perceive auditory or visual hallucinations.        Mood and Affect:  Mood normal. Mood is not anxious or depressed. Affect is not labile, blunt, angry or inappropriate.        Speech: Speech normal.        Behavior: Behavior normal.        Thought Content: Thought content normal. Thought content is not paranoid or delusional. Thought content does not include homicidal or suicidal ideation. Thought content does not include homicidal or suicidal plan.        Cognition and Memory: Cognition and memory normal.        Judgment: Judgment normal.     Comments: Insight intact     Lab Review:     Component Value Date/Time   NA 140 06/02/2019 0904   K 4.1 06/02/2019 0904   CL 103 06/02/2019 0904   CO2 31 06/02/2019 0904   GLUCOSE 89 06/02/2019 0904   BUN 10 06/02/2019 0904   CREATININE 0.85 06/02/2019 0904   CALCIUM 9.1 06/02/2019 0904   PROT 6.8 06/02/2019 0904   ALBUMIN 4.7 06/02/2019 0904   AST 14 06/02/2019 0904   ALT 17 06/02/2019 0904   ALKPHOS 99 06/02/2019 0904   BILITOT 0.5 06/02/2019 0904   GFRNONAA >60 04/26/2018 1021   GFRAA >60 04/26/2018 1021       Component Value Date/Time   WBC 4.1 06/02/2019 0904   RBC 4.67 06/02/2019 0904   HGB 15.0 06/02/2019 0904   HCT 43.9 06/02/2019 0904   PLT 146.0 (L) 06/02/2019 0904   MCV 94.1 06/02/2019 0904   MCH 31.0 04/26/2018 1021   MCHC 34.2 06/02/2019 0904   RDW 12.2  06/02/2019 0904   LYMPHSABS 1.4 04/26/2018 1021   MONOABS 0.7 04/26/2018 1021   EOSABS 0.1 04/26/2018 1021   BASOSABS 0.0 04/26/2018 1021    No results found for: POCLITH, LITHIUM   No results found for: PHENYTOIN, PHENOBARB, VALPROATE, CBMZ   .res Assessment: Plan:    Plan:  1. Stop Cogentin 0.5mg  daily - increased constipation 2. Temazepam 30mg  at hs 3. Carbamazepine 200mg  BID 4. Decrease Risperdal 2mg  to 1mg  at bedtime  5. Wellbutrin XL 150mg  - 2 every morning  RTC 4/6 weeks  Discussed risks associated with lowering Risperdal. Patient and wife agree to move forward with decreasing the dose. Has tolerated Risperdal from 4mg  to 3mg .   Patient advised to contact office with any questions, adverse effects, or acute worsening in signs and symptoms.  Discussed potential metabolic side effects associated with atypical antipsychotics, as well as potential risk for movement side effects. Advised pt to contact office if movement side effects occur.   Discussed potential benefits, risk, and side effects of benzodiazepines to include potential risk of tolerance and dependence, as well as possible drowsiness.  Advised patient not to drive if experiencing drowsiness and to take lowest possible effective dose to minimize risk of dependence and tolerance.   Diagnoses and all orders for this visit:  PTSD (post-traumatic stress disorder)  Insomnia, unspecified type  Bipolar I disorder (HCC)  Major depressive disorder, recurrent episode, moderate (HCC)  Generalized anxiety disorder     Please see After Visit Summary for patient specific instructions.  No future appointments.  No orders of the defined types were placed in this encounter.     -------------------------------

## 2019-10-23 ENCOUNTER — Other Ambulatory Visit: Payer: Self-pay | Admitting: Adult Health

## 2019-10-23 DIAGNOSIS — G4700 Insomnia, unspecified: Secondary | ICD-10-CM

## 2019-11-07 ENCOUNTER — Telehealth: Payer: Federal, State, Local not specified - PPO | Admitting: Adult Health

## 2019-11-07 ENCOUNTER — Encounter: Payer: Self-pay | Admitting: Adult Health

## 2019-11-07 DIAGNOSIS — F411 Generalized anxiety disorder: Secondary | ICD-10-CM | POA: Diagnosis not present

## 2019-11-07 DIAGNOSIS — G4700 Insomnia, unspecified: Secondary | ICD-10-CM

## 2019-11-07 DIAGNOSIS — F331 Major depressive disorder, recurrent, moderate: Secondary | ICD-10-CM

## 2019-11-07 DIAGNOSIS — F319 Bipolar disorder, unspecified: Secondary | ICD-10-CM

## 2019-11-07 DIAGNOSIS — F4310 Post-traumatic stress disorder, unspecified: Secondary | ICD-10-CM | POA: Diagnosis not present

## 2019-11-07 NOTE — Progress Notes (Signed)
Christian Baldwin [id number] July 17, 1975 44 y.o.  Virtual Visit via Video Note  I connected with pt @ on 11/07/19 at  3:40 PM EDT by a video enabled telemedicine application and verified that I am speaking with the correct person using two identifiers.   I discussed the limitations of evaluation and management by telemedicine and the availability of in person appointments. The patient expressed understanding and agreed to proceed.  I discussed the assessment and treatment plan with the patient. The patient was provided an opportunity to ask questions and all were answered. The patient agreed with the plan and demonstrated an understanding of the instructions.   The patient was advised to call back or seek an in-person evaluation if the symptoms worsen or if the condition fails to improve as anticipated.  I provided 30 minutes of non-face-to-face time during this encounter.  The patient was located at home.  The provider was located at [facility].   Dorothyann Gibbs, NP   Subjective:   Patient ID:  Christian Baldwin is a 44 y.o. (DOB September 11, 1975) male.  Chief Complaint: No chief complaint on file.   HPI Christian Baldwin presents for follow-up of GAD, MDD, insomnia, BPD-1, and PTSD  Describes mood today as "ok". Pleasant. Mood symptoms - increased anxiety in work setting - having some racing thoughts - "just work related". Trying to "keep up" with things. Denies depression. Reports "some" irritability. Stopped Cogentin - constipation improved. Has decreased Risperdal 2mg  to 1mg  daily.   Wife notes some irritability and loss of sleep. Has displayed more energy - coming home and doing more. More concerned about how he looks. Felt like he was more peaceful and not as sharp on the Risperdal 2mg  dose. Stable interest and motivation. Taking medications as prescribed.   Energy levels "ok". Active, does not have a regular exercise routine. Walking dog. Riding bikes. Enjoys some  usual interests and activities. Married. Lives with wife of 17 years and 2 children. Spending time with family.  Appetite adequate. Weight stable - 166 pounds.  Sleeps well most nights. Averages 6 hours. Waking up at 3 and can't get back to sleep. Losing 1 to 2 hours of sleep. Focus and concentration difficulties. Completing tasks. Managing aspects of household. Works 40 hours a week. Denies SI or HI. Denies AH or VH.     Past Psychiatric History: Admitted in March 2020 for mania after starting Zoloft.   Review of Systems:  Review of Systems  Musculoskeletal: Negative for gait problem.  Neurological: Negative for tremors.  Psychiatric/Behavioral:       Please refer to HPI    Medications: I have reviewed the patient's current medications.  Current Outpatient Medications  Medication Sig Dispense Refill  . benztropine (COGENTIN) 0.5 MG tablet Take 1 tablet (0.5 mg total) by mouth 2 (two) times daily. 180 tablet 1  . buPROPion (WELLBUTRIN XL) 150 MG 24 hr tablet Take two tablets every morning. 180 tablet 1  . carbamazepine (TEGRETOL) 200 MG tablet Take 2 tablets (400 mg total) by mouth at bedtime. 180 tablet 1  . fluticasone (FLONASE) 50 MCG/ACT nasal spray SPRAY 2 SPRAYS INTO EACH NOSTRIL EVERY DAY 48 mL 1  . montelukast (SINGULAIR) 10 MG tablet TAKE 1 TABLET BY MOUTH EVERYDAY AT BEDTIME 90 tablet 1  . omega-3 acid ethyl esters (LOVAZA) 1 g capsule Take 1 capsule (1 g total) by mouth 2 (two) times daily. 60 capsule 5  . risperiDONE (RISPERDAL) 2 MG tablet Take 1 tablet (2 mg total)  by mouth at bedtime. 90 tablet 1  . sodium chloride HYPERTONIC 3 % nebulizer solution Take by nebulization as needed for other or cough (congestion). (Patient not taking: Reported on 06/02/2019) 750 mL 12  . temazepam (RESTORIL) 30 MG capsule TAKE 1 CAPSULE (30 MG TOTAL) BY MOUTH AT BEDTIME. 30 capsule 2  . VITAMIN D, CHOLECALCIFEROL, PO Take 1 capsule by mouth daily.      No current facility-administered  medications for this visit.    Medication Side Effects: None  Allergies: No Known Allergies  Past Medical History:  Diagnosis Date  . Allergy   . Bipolar 1 disorder (HCC)     Family History  Problem Relation Age of Onset  . Cancer Neg Hx   . Other Neg Hx        low testosterone    Social History   Socioeconomic History  . Marital status: Married    Spouse name: Not on file  . Number of children: Not on file  . Years of education: Not on file  . Highest education level: Not on file  Occupational History  . Not on file  Tobacco Use  . Smoking status: Current Every Day Smoker    Packs/day: 0.50    Types: Cigarettes, E-cigarettes  . Smokeless tobacco: Never Used  Substance and Sexual Activity  . Alcohol use: Yes  . Drug use: Not on file  . Sexual activity: Not on file  Other Topics Concern  . Not on file  Social History Narrative  . Not on file   Social Determinants of Health   Financial Resource Strain:   . Difficulty of Paying Living Expenses: Not on file  Food Insecurity:   . Worried About Programme researcher, broadcasting/film/video in the Last Year: Not on file  . Ran Out of Food in the Last Year: Not on file  Transportation Needs:   . Lack of Transportation (Medical): Not on file  . Lack of Transportation (Non-Medical): Not on file  Physical Activity:   . Days of Exercise per Week: Not on file  . Minutes of Exercise per Session: Not on file  Stress:   . Feeling of Stress : Not on file  Social Connections:   . Frequency of Communication with Friends and Family: Not on file  . Frequency of Social Gatherings with Friends and Family: Not on file  . Attends Religious Services: Not on file  . Active Member of Clubs or Organizations: Not on file  . Attends Banker Meetings: Not on file  . Marital Status: Not on file  Intimate Partner Violence:   . Fear of Current or Ex-Partner: Not on file  . Emotionally Abused: Not on file  . Physically Abused: Not on file  .  Sexually Abused: Not on file    Past Medical History, Surgical history, Social history, and Family history were reviewed and updated as appropriate.   Please see review of systems for further details on the patient's review from today.   Objective:   Physical Exam:  There were no vitals taken for this visit.  Physical Exam Constitutional:      General: He is not in acute distress. Musculoskeletal:        General: No deformity.  Neurological:     Mental Status: He is alert and oriented to person, place, and time.     Coordination: Coordination normal.  Psychiatric:        Attention and Perception: Attention and perception normal. He does  not perceive auditory or visual hallucinations.        Mood and Affect: Mood normal. Mood is not anxious or depressed. Affect is not labile, blunt, angry or inappropriate.        Speech: Speech normal.        Behavior: Behavior normal.        Thought Content: Thought content normal. Thought content is not paranoid or delusional. Thought content does not include homicidal or suicidal ideation. Thought content does not include homicidal or suicidal plan.        Cognition and Memory: Cognition and memory normal.        Judgment: Judgment normal.     Comments: Insight intact     Lab Review:     Component Value Date/Time   NA 140 06/02/2019 0904   K 4.1 06/02/2019 0904   CL 103 06/02/2019 0904   CO2 31 06/02/2019 0904   GLUCOSE 89 06/02/2019 0904   BUN 10 06/02/2019 0904   CREATININE 0.85 06/02/2019 0904   CALCIUM 9.1 06/02/2019 0904   PROT 6.8 06/02/2019 0904   ALBUMIN 4.7 06/02/2019 0904   AST 14 06/02/2019 0904   ALT 17 06/02/2019 0904   ALKPHOS 99 06/02/2019 0904   BILITOT 0.5 06/02/2019 0904   GFRNONAA >60 04/26/2018 1021   GFRAA >60 04/26/2018 1021       Component Value Date/Time   WBC 4.1 06/02/2019 0904   RBC 4.67 06/02/2019 0904   HGB 15.0 06/02/2019 0904   HCT 43.9 06/02/2019 0904   PLT 146.0 (L) 06/02/2019 0904   MCV  94.1 06/02/2019 0904   MCH 31.0 04/26/2018 1021   MCHC 34.2 06/02/2019 0904   RDW 12.2 06/02/2019 0904   LYMPHSABS 1.4 04/26/2018 1021   MONOABS 0.7 04/26/2018 1021   EOSABS 0.1 04/26/2018 1021   BASOSABS 0.0 04/26/2018 1021    No results found for: POCLITH, LITHIUM   No results found for: PHENYTOIN, PHENOBARB, VALPROATE, CBMZ   .res Assessment: Plan:    Plan:  1. Wellbutrin XL 150mg  - 2 every morning 2. Temazepam 30mg  at hs 3. Carbamazepine 200mg  BID 4. Increase Risperdal 1mg  to 1.5mg  at bedtime   RTC 4/6 weeks  Patient advised to contact office with any questions, adverse effects, or acute worsening in signs and symptoms.  Discussed potential metabolic side effects associated with atypical antipsychotics, as well as potential risk for movement side effects. Adcontact office if movement side effects occur.   Discussed potential benefits, risk, and side effects of benzodiazepines to include potential risk of tolerance and dependence, as well as possibvised pt to le drowsiness.  Advised patient not to drive if experiencing drowsiness and to take lowest possible effective dose to minimize risk of dependence and tolerance.    There are no diagnoses linked to this encounter.   Please see After Visit Summary for patient specific instructions.  Future Appointments  Date Time Provider Department Center  11/07/2019  3:40 PM Peggye Poon, , NP [facility] None    No orders of the defined types were placed in this encounter.     -------------------------------

## 2019-11-16 ENCOUNTER — Ambulatory Visit (INDEPENDENT_AMBULATORY_CARE_PROVIDER_SITE_OTHER): Payer: Federal, State, Local not specified - PPO

## 2019-11-16 ENCOUNTER — Other Ambulatory Visit: Payer: Self-pay

## 2019-11-16 DIAGNOSIS — Z23 Encounter for immunization: Secondary | ICD-10-CM

## 2019-12-19 ENCOUNTER — Telehealth (INDEPENDENT_AMBULATORY_CARE_PROVIDER_SITE_OTHER): Payer: Federal, State, Local not specified - PPO | Admitting: Adult Health

## 2019-12-19 ENCOUNTER — Encounter: Payer: Self-pay | Admitting: Adult Health

## 2019-12-19 DIAGNOSIS — F4310 Post-traumatic stress disorder, unspecified: Secondary | ICD-10-CM

## 2019-12-19 DIAGNOSIS — F331 Major depressive disorder, recurrent, moderate: Secondary | ICD-10-CM

## 2019-12-19 DIAGNOSIS — F319 Bipolar disorder, unspecified: Secondary | ICD-10-CM

## 2019-12-19 DIAGNOSIS — F411 Generalized anxiety disorder: Secondary | ICD-10-CM | POA: Diagnosis not present

## 2019-12-19 DIAGNOSIS — G4700 Insomnia, unspecified: Secondary | ICD-10-CM

## 2019-12-19 NOTE — Progress Notes (Signed)
Christian Baldwin [id number] [birth year] 44 y.o.  Virtual Visit via Video Note  I connected with pt @ on 12/19/19 at  4:20 PM EST by a video enabled telemedicine application and verified that I am speaking with the correct person using two identifiers.   I discussed the limitations of evaluation and management by telemedicine and the availability of in person appointments. The patient expressed understanding and agreed to proceed.  I discussed the assessment and treatment plan with the patient. The patient was provided an opportunity to ask questions and all were answered. The patient agreed with the plan and demonstrated an understanding of the instructions.   The patient was advised to call back or seek an in-person evaluation if the symptoms worsen or if the condition fails to improve as anticipated.  I provided 30 minutes of non-face-to-face time during this encounter.  The patient was located at home.  The provider was located at [facility].   Dorothyann Gibbs, NP   Subjective:   Patient ID:  Christian Baldwin is a 44 y.o. (DOB 1975-06-02) male.  Chief Complaint: No chief complaint on file.   HPI Zavian Slowey presents for follow-up of GAD, MDD, insomnia, BPD-1, and PTSD  Describes mood today as "ok". Pleasant. Mood symptoms - decreased depression, anxiety, and irritability. Stating "I'm doing alright". Reports "some racing thoughts". Wife feels like he has done better with increasing the dose of Risperdal from 1mg  to 1.5mg  at hs. She feels he is less "cranky". More "comfortable" overall. Stable interest and motivation. Taking medications as prescribed.  Energy levels "ok". Active, does not have a regular exercise routine. Walking some.  Enjoys some usual interests and activities. Married. Lives with wife of 17 years and 2 children. Spending time with family.  Appetite adequate. Weight stable - 168 pounds.  Sleeps well most nights. Averages 6.5 hours. Waking  up at 4:30 and can't get back to sleep. Focus and concentration "ok". Has several "irons in the fire at work". Completing tasks. Managing aspects of household. Works 40 hours a week. Denies SI or HI. Denies AH or VH.    Past Psychiatric History: Admitted in March 2020 for mania after starting Zoloft.  Review of Systems:  Review of Systems  Musculoskeletal: Negative for gait problem.  Neurological: Negative for tremors.  Psychiatric/Behavioral:       Please refer to HPI    Medications: I have reviewed the patient's current medications.  Current Outpatient Medications  Medication Sig Dispense Refill   benztropine (COGENTIN) 0.5 MG tablet Take 1 tablet (0.5 mg total) by mouth 2 (two) times daily. 180 tablet 1   buPROPion (WELLBUTRIN XL) 150 MG 24 hr tablet Take two tablets every morning. 180 tablet 1   carbamazepine (TEGRETOL) 200 MG tablet Take 2 tablets (400 mg total) by mouth at bedtime. 180 tablet 1   fluticasone (FLONASE) 50 MCG/ACT nasal spray SPRAY 2 SPRAYS INTO EACH NOSTRIL EVERY DAY 48 mL 1   montelukast (SINGULAIR) 10 MG tablet TAKE 1 TABLET BY MOUTH EVERYDAY AT BEDTIME 90 tablet 1   omega-3 acid ethyl esters (LOVAZA) 1 g capsule Take 1 capsule (1 g total) by mouth 2 (two) times daily. 60 capsule 5   risperiDONE (RISPERDAL) 2 MG tablet Take 1 tablet (2 mg total) by mouth at bedtime. 90 tablet 1   sodium chloride HYPERTONIC 3 % nebulizer solution Take by nebulization as needed for other or cough (congestion). (Patient not taking: Reported on 06/02/2019) 750 mL 12   temazepam (RESTORIL) 30  MG capsule TAKE 1 CAPSULE (30 MG TOTAL) BY MOUTH AT BEDTIME. 30 capsule 2   VITAMIN D, CHOLECALCIFEROL, PO Take 1 capsule by mouth daily.      No current facility-administered medications for this visit.    Medication Side Effects: None  Allergies: No Known Allergies  Past Medical History:  Diagnosis Date   Allergy    Bipolar 1 disorder (HCC)     Family History  Problem  Relation Age of Onset   Cancer Neg Hx    Other Neg Hx        low testosterone    Social History   Socioeconomic History   Marital status: Married    Spouse name: Not on file   Number of children: Not on file   Years of education: Not on file   Highest education level: Not on file  Occupational History   Not on file  Tobacco Use   Smoking status: Current Every Day Smoker    Packs/day: 0.50    Types: Cigarettes, E-cigarettes   Smokeless tobacco: Never Used  Substance and Sexual Activity   Alcohol use: Yes   Drug use: Not on file   Sexual activity: Not on file  Other Topics Concern   Not on file  Social History Narrative   Not on file   Social Determinants of Health   Financial Resource Strain:    Difficulty of Paying Living Expenses: Not on file  Food Insecurity:    Worried About Running Out of Food in the Last Year: Not on file   Ran Out of Food in the Last Year: Not on file  Transportation Needs:    Lack of Transportation (Medical): Not on file   Lack of Transportation (Non-Medical): Not on file  Physical Activity:    Days of Exercise per Week: Not on file   Minutes of Exercise per Session: Not on file  Stress:    Feeling of Stress : Not on file  Social Connections:    Frequency of Communication with Friends and Family: Not on file   Frequency of Social Gatherings with Friends and Family: Not on file   Attends Religious Services: Not on file   Active Member of Clubs or Organizations: Not on file   Attends Banker Meetings: Not on file   Marital Status: Not on file  Intimate Partner Violence:    Fear of Current or Ex-Partner: Not on file   Emotionally Abused: Not on file   Physically Abused: Not on file   Sexually Abused: Not on file    Past Medical History, Surgical history, Social history, and Family history were reviewed and updated as appropriate.   Please see review of systems for further details on the  patient's review from today.   Objective:   Physical Exam:  There were no vitals taken for this visit.  Physical Exam Constitutional:      General: He is not in acute distress. Musculoskeletal:        General: No deformity.  Neurological:     Mental Status: He is alert and oriented to person, place, and time.     Coordination: Coordination normal.  Psychiatric:        Attention and Perception: Attention and perception normal. He does not perceive auditory or visual hallucinations.        Mood and Affect: Mood normal. Mood is not anxious or depressed. Affect is not labile, blunt, angry or inappropriate.        Speech:  Speech normal.        Behavior: Behavior normal.        Thought Content: Thought content normal. Thought content is not paranoid or delusional. Thought content does not include homicidal or suicidal ideation. Thought content does not include homicidal or suicidal plan.        Cognition and Memory: Cognition and memory normal.        Judgment: Judgment normal.     Comments: Insight intact     Lab Review:     Component Value Date/Time   NA 140 06/02/2019 0904   K 4.1 06/02/2019 0904   CL 103 06/02/2019 0904   CO2 31 06/02/2019 0904   GLUCOSE 89 06/02/2019 0904   BUN 10 06/02/2019 0904   CREATININE 0.85 06/02/2019 0904   CALCIUM 9.1 06/02/2019 0904   PROT 6.8 06/02/2019 0904   ALBUMIN 4.7 06/02/2019 0904   AST 14 06/02/2019 0904   ALT 17 06/02/2019 0904   ALKPHOS 99 06/02/2019 0904   BILITOT 0.5 06/02/2019 0904   GFRNONAA >60 04/26/2018 1021   GFRAA >60 04/26/2018 1021       Component Value Date/Time   WBC 4.1 06/02/2019 0904   RBC 4.67 06/02/2019 0904   HGB 15.0 06/02/2019 0904   HCT 43.9 06/02/2019 0904   PLT 146.0 (L) 06/02/2019 0904   MCV 94.1 06/02/2019 0904   MCH 31.0 04/26/2018 1021   MCHC 34.2 06/02/2019 0904   RDW 12.2 06/02/2019 0904   LYMPHSABS 1.4 04/26/2018 1021   MONOABS 0.7 04/26/2018 1021   EOSABS 0.1 04/26/2018 1021   BASOSABS  0.0 04/26/2018 1021    No results found for: POCLITH, LITHIUM   No results found for: PHENYTOIN, PHENOBARB, VALPROATE, CBMZ   .res Assessment: Plan:    Plan:  1. Wellbutrin XL 150mg  - 2 every morning 2. Temazepam 30mg  at hs 3. Carbamazepine 200mg  BID 4. Risperdal 1.5mg  at bedtime   RTC 2.5 months  Patient advised to contact office with any questions, adverse effects, or acute worsening in signs and symptoms.  Discussed potential metabolic side effects associated with atypical antipsychotics, as well as potential risk for movement side effects. Adcontact office if movement side effects occur.   Discussed potential benefits, risk, and side effects of benzodiazepines to include potential risk of tolerance and dependence, as well as possibvised pt to le drowsiness.  Advised patient not to drive if experiencing drowsiness and to take lowest possible effective dose to minimize risk of dependence and tolerance.   Diagnoses and all orders for this visit:  PTSD (post-traumatic stress disorder)  Insomnia, unspecified type  Major depressive disorder, recurrent episode, moderate (HCC)  Generalized anxiety disorder  Bipolar I disorder (HCC)     Please see After Visit Summary for patient specific instructions.  No future appointments.  No orders of the defined types were placed in this encounter.     -------------------------------

## 2020-01-21 ENCOUNTER — Other Ambulatory Visit: Payer: Self-pay | Admitting: Adult Health

## 2020-01-21 DIAGNOSIS — G4700 Insomnia, unspecified: Secondary | ICD-10-CM

## 2020-02-19 ENCOUNTER — Other Ambulatory Visit: Payer: Self-pay | Admitting: Adult Health

## 2020-02-19 DIAGNOSIS — F411 Generalized anxiety disorder: Secondary | ICD-10-CM

## 2020-02-19 DIAGNOSIS — F331 Major depressive disorder, recurrent, moderate: Secondary | ICD-10-CM

## 2020-03-05 ENCOUNTER — Other Ambulatory Visit: Payer: Self-pay | Admitting: Adult Health

## 2020-03-05 DIAGNOSIS — F319 Bipolar disorder, unspecified: Secondary | ICD-10-CM

## 2020-03-19 ENCOUNTER — Telehealth (INDEPENDENT_AMBULATORY_CARE_PROVIDER_SITE_OTHER): Payer: Federal, State, Local not specified - PPO | Admitting: Adult Health

## 2020-03-19 ENCOUNTER — Encounter: Payer: Self-pay | Admitting: Adult Health

## 2020-03-19 DIAGNOSIS — F4310 Post-traumatic stress disorder, unspecified: Secondary | ICD-10-CM | POA: Diagnosis not present

## 2020-03-19 DIAGNOSIS — F319 Bipolar disorder, unspecified: Secondary | ICD-10-CM

## 2020-03-19 DIAGNOSIS — F331 Major depressive disorder, recurrent, moderate: Secondary | ICD-10-CM

## 2020-03-19 DIAGNOSIS — G4700 Insomnia, unspecified: Secondary | ICD-10-CM | POA: Diagnosis not present

## 2020-03-19 DIAGNOSIS — F411 Generalized anxiety disorder: Secondary | ICD-10-CM

## 2020-03-19 MED ORDER — RISPERIDONE 0.5 MG PO TABS: 0.5000 mg | ORAL_TABLET | Freq: Every day | ORAL | 1 refills | Status: DC

## 2020-03-19 MED ORDER — RISPERIDONE 1 MG PO TABS: ORAL_TABLET | ORAL | 1 refills | Status: DC

## 2020-03-19 NOTE — Progress Notes (Signed)
Christian Baldwin [id number] 17-May-1975 45 y.o.  Virtual Visit via Video Note  I connected with pt @ on 03/19/20 at  2:00 PM EST by a video enabled telemedicine application and verified that I am speaking with the correct person using two identifiers.   I discussed the limitations of evaluation and management by telemedicine and the availability of in person appointments. The patient expressed understanding and agreed to proceed.  I discussed the assessment and treatment plan with the patient. The patient was provided an opportunity to ask questions and all were answered. The patient agreed with the plan and demonstrated an understanding of the instructions.   The patient was advised to call back or seek an in-person evaluation if the symptoms worsen or if the condition fails to improve as anticipated.  I provided 30 minutes of non-face-to-face time during this encounter.  The patient was located at home.  The provider was located at [facility].   Christian Gibbs, NP   Subjective:   Patient ID:  Christian Baldwin is a 45 y.o. (DOB 1975-12-18) male.  Chief Complaint: No chief complaint on file.   HPI Christian Baldwin presents for follow-up of GAD, MDD, insomnia, BPD-1, and PTSD  Describes mood today as "ok". Pleasant. Mood symptoms - decreased depression, anxiety, and irritability. Stating "I'm doing alright". Reports "some racing thoughts" - "mostlty at night". Waking up some nights and can't get back to sleep. Feels like medications are working well for him. Wife also commenting on his progress and feels like he is doing well. Participates with family - "had a good holiday". Stable interest and motivation. Taking medications as prescribed.  Energy levels "ok". Active, does not have a regular exercise routine.  Enjoys some usual interests and activities. Married. Lives with wife of 17 years and 2 children. Spending time with family.  Appetite adequate. Weight stable  - 173 pounds.  Sleeps well most nights. Averages 6.5 hours.  Focus and concentration stable. Completing tasks. Managing aspects of household. Works 40 hours a week. Denies SI or HI.  Denies AH or VH.    Past Psychiatric History: Admitted in March 2020 for mania after starting Zoloft.  Review of Systems:  Review of Systems  Musculoskeletal: Negative for gait problem.  Neurological: Negative for tremors.  Psychiatric/Behavioral:       Please refer to HPI    Medications: I have reviewed the patient's current medications.  Current Outpatient Medications  Medication Sig Dispense Refill  . risperiDONE (RISPERDAL) 0.5 MG tablet Take 1 tablet (0.5 mg total) by mouth at bedtime. 90 tablet 1  . buPROPion (WELLBUTRIN XL) 150 MG 24 hr tablet TAKE TWO TABLETS EVERY MORNING. 180 tablet 1  . carbamazepine (TEGRETOL) 200 MG tablet TAKE 2 TABLETS (400 MG TOTAL) BY MOUTH AT BEDTIME. 180 tablet 1  . fluticasone (FLONASE) 50 MCG/ACT nasal spray SPRAY 2 SPRAYS INTO EACH NOSTRIL EVERY DAY 48 mL 1  . montelukast (SINGULAIR) 10 MG tablet TAKE 1 TABLET BY MOUTH EVERYDAY AT BEDTIME 90 tablet 1  . omega-3 acid ethyl esters (LOVAZA) 1 g capsule Take 1 capsule (1 g total) by mouth 2 (two) times daily. 60 capsule 5  . risperiDONE (RISPERDAL) 1 MG tablet Take one tablet at bedtime. 90 tablet 1  . sodium chloride HYPERTONIC 3 % nebulizer solution Take by nebulization as needed for other or cough (congestion). (Patient not taking: Reported on 06/02/2019) 750 mL 12  . temazepam (RESTORIL) 30 MG capsule TAKE 1 CAPSULE (30 MG TOTAL) BY MOUTH  AT BEDTIME. 30 capsule 2  . VITAMIN D, CHOLECALCIFEROL, PO Take 1 capsule by mouth daily.      No current facility-administered medications for this visit.    Medication Side Effects: None  Allergies: No Known Allergies  Past Medical History:  Diagnosis Date  . Allergy   . Bipolar 1 disorder (HCC)     Family History  Problem Relation Age of Onset  . Cancer Neg Hx   .  Other Neg Hx        low testosterone    Social History   Socioeconomic History  . Marital status: Married    Spouse name: Not on file  . Number of children: Not on file  . Years of education: Not on file  . Highest education level: Not on file  Occupational History  . Not on file  Tobacco Use  . Smoking status: Current Every Day Smoker    Packs/day: 0.50    Types: Cigarettes, E-cigarettes  . Smokeless tobacco: Never Used  Substance and Sexual Activity  . Alcohol use: Yes  . Drug use: Not on file  . Sexual activity: Not on file  Other Topics Concern  . Not on file  Social History Narrative  . Not on file   Social Determinants of Health   Financial Resource Strain: Not on file  Food Insecurity: Not on file  Transportation Needs: Not on file  Physical Activity: Not on file  Stress: Not on file  Social Connections: Not on file  Intimate Partner Violence: Not on file    Past Medical History, Surgical history, Social history, and Family history were reviewed and updated as appropriate.   Please see review of systems for further details on the patient's review from today.   Objective:   Physical Exam:  There were no vitals taken for this visit.  Physical Exam Constitutional:      General: He is not in acute distress. Musculoskeletal:        General: No deformity.  Neurological:     Mental Status: He is alert and oriented to person, place, and time.     Coordination: Coordination normal.  Psychiatric:        Attention and Perception: Attention and perception normal. He does not perceive auditory or visual hallucinations.        Mood and Affect: Mood normal. Mood is not anxious or depressed. Affect is not labile, blunt, angry or inappropriate.        Speech: Speech normal.        Behavior: Behavior normal.        Thought Content: Thought content normal. Thought content is not paranoid or delusional. Thought content does not include homicidal or suicidal ideation.  Thought content does not include homicidal or suicidal plan.        Cognition and Memory: Cognition and memory normal.        Judgment: Judgment normal.     Comments: Insight intact     Lab Review:     Component Value Date/Time   NA 140 06/02/2019 0904   K 4.1 06/02/2019 0904   CL 103 06/02/2019 0904   CO2 31 06/02/2019 0904   GLUCOSE 89 06/02/2019 0904   BUN 10 06/02/2019 0904   CREATININE 0.85 06/02/2019 0904   CALCIUM 9.1 06/02/2019 0904   PROT 6.8 06/02/2019 0904   ALBUMIN 4.7 06/02/2019 0904   AST 14 06/02/2019 0904   ALT 17 06/02/2019 0904   ALKPHOS 99 06/02/2019 0904  BILITOT 0.5 06/02/2019 0904   GFRNONAA >60 04/26/2018 1021   GFRAA >60 04/26/2018 1021       Component Value Date/Time   WBC 4.1 06/02/2019 0904   RBC 4.67 06/02/2019 0904   HGB 15.0 06/02/2019 0904   HCT 43.9 06/02/2019 0904   PLT 146.0 (L) 06/02/2019 0904   MCV 94.1 06/02/2019 0904   MCH 31.0 04/26/2018 1021   MCHC 34.2 06/02/2019 0904   RDW 12.2 06/02/2019 0904   LYMPHSABS 1.4 04/26/2018 1021   MONOABS 0.7 04/26/2018 1021   EOSABS 0.1 04/26/2018 1021   BASOSABS 0.0 04/26/2018 1021    No results found for: POCLITH, LITHIUM   No results found for: PHENYTOIN, PHENOBARB, VALPROATE, CBMZ   .res Assessment: Plan:     Plan:  1. Wellbutrin XL 150mg  - 2 every morning 2. Temazepam 30mg  at hs 3. Carbamazepine 200mg  BID 4. Risperdal 1.5mg   mg at bedtime   RTC 2.5 months  Patient advised to contact office with any questions, adverse effects, or acute worsening in signs and symptoms.  Discussed potential metabolic side effects associated with atypical antipsychotics, as well as potential risk for movement side effects. Adcontact office if movement side effects occur.   Discussed potential benefits, risk, and side effects of benzodiazepines to include potential risk of tolerance and dependence, as well as possibvised pt to le drowsiness.  Advised patient not to drive if experiencing  drowsiness and to take lowest possible effective dose to minimize risk of dependence and tolerance.   Diagnoses and all orders for this visit:  PTSD (post-traumatic stress disorder)  Bipolar I disorder (HCC) -     risperiDONE (RISPERDAL) 1 MG tablet; Take one tablet at bedtime. -     risperiDONE (RISPERDAL) 0.5 MG tablet; Take 1 tablet (0.5 mg total) by mouth at bedtime.  Insomnia, unspecified type  Major depressive disorder, recurrent episode, moderate (HCC)  Generalized anxiety disorder     Please see After Visit Summary for patient specific instructions.  Future Appointments  Date Time Provider Department Center  06/18/2020  2:00 PM Durand Wittmeyer, , NP [facility] None    No orders of the defined types were placed in this encounter.     -------------------------------

## 2020-04-21 ENCOUNTER — Other Ambulatory Visit: Payer: Self-pay | Admitting: Adult Health

## 2020-04-21 DIAGNOSIS — G4700 Insomnia, unspecified: Secondary | ICD-10-CM

## 2020-06-18 ENCOUNTER — Encounter: Payer: Self-pay | Admitting: Adult Health

## 2020-06-18 ENCOUNTER — Telehealth (INDEPENDENT_AMBULATORY_CARE_PROVIDER_SITE_OTHER): Payer: Federal, State, Local not specified - PPO | Admitting: Adult Health

## 2020-06-18 DIAGNOSIS — F4310 Post-traumatic stress disorder, unspecified: Secondary | ICD-10-CM | POA: Diagnosis not present

## 2020-06-18 DIAGNOSIS — F411 Generalized anxiety disorder: Secondary | ICD-10-CM

## 2020-06-18 DIAGNOSIS — F319 Bipolar disorder, unspecified: Secondary | ICD-10-CM

## 2020-06-18 DIAGNOSIS — F331 Major depressive disorder, recurrent, moderate: Secondary | ICD-10-CM | POA: Diagnosis not present

## 2020-06-18 DIAGNOSIS — G4700 Insomnia, unspecified: Secondary | ICD-10-CM | POA: Diagnosis not present

## 2020-06-18 MED ORDER — TEMAZEPAM 30 MG PO CAPS: 30.0000 mg | ORAL_CAPSULE | Freq: Every day | ORAL | 2 refills | Status: DC

## 2020-06-18 MED ORDER — RISPERIDONE 0.5 MG PO TABS: 0.5000 mg | ORAL_TABLET | Freq: Every day | ORAL | 1 refills | Status: DC

## 2020-06-18 MED ORDER — BUPROPION HCL ER (XL) 150 MG PO TB24: ORAL_TABLET | ORAL | 1 refills | Status: DC

## 2020-06-18 MED ORDER — RISPERIDONE 1 MG PO TABS: ORAL_TABLET | ORAL | 1 refills | Status: DC

## 2020-06-18 MED ORDER — CARBAMAZEPINE 200 MG PO TABS: 400.0000 mg | ORAL_TABLET | Freq: Every day | ORAL | 1 refills | Status: DC

## 2020-06-18 NOTE — Progress Notes (Signed)
Christian Baldwin [id number] 1975-09-25 45 y.o.  Virtual Visit via Video Note  I connected with pt @ on 06/18/20 at  2:00 PM EDT by a video enabled telemedicine application and verified that I am speaking with the correct person using two identifiers.   I discussed the limitations of evaluation and management by telemedicine and the availability of in person appointments. The patient expressed understanding and agreed to proceed.  I discussed the assessment and treatment plan with the patient. The patient was provided an opportunity to ask questions and all were answered. The patient agreed with the plan and demonstrated an understanding of the instructions.   The patient was advised to call back or seek an in-person evaluation if the symptoms worsen or if the condition fails to improve as anticipated.  I provided 30 minutes of non-face-to-face time during this encounter.  The patient was located at home.  The provider was located at [facility].   Christian Gibbs, NP   Subjective:   Patient ID:  Christian Baldwin is a 45 y.o. y.o. (DOB 24-Oct-1975) male.  Chief Complaint: No chief complaint on file.   HPI Christian Baldwin presents for follow-up of GAD, MDD, insomnia, BPD-1, and PTSD  Describes mood today as "ok". Pleasant. Mood symptoms - decreased depression, anxiety, and irritability. Stating "I'm doing pretty good". Reports racing thoughts in the evenings - "things I need to do". Feels like medications continue to work well. Wife feels he has done "great". Active with family. Stable interest and motivation. Taking medications as prescribed.  Energy levels "decent - not needing to take naps". Active, does not have a regular exercise routine. Doing things out doors. Playing basketball with son. Enjoys some usual interests and activities. Married. Lives with wife of 17 years and 2 children. Spending time with family.  Appetite adequate. Weight gain - 16 pounds - 189  pounds.  Sleeps well most nights. Averages 7.5 hours.  Focus and concentration stable. Completing tasks. Managing aspects of household. Works 40 hours a week. Denies SI or HI.  Denies AH or VH.    Past Psychiatric History: Admitted in March 2020 for mania after starting Zoloft.  Review of Systems:  Review of Systems  Musculoskeletal: Negative for gait problem.  Neurological: Negative for tremors.  Psychiatric/Behavioral:       Please refer to HPI    Medications: I have reviewed the patient's current medications.  Current Outpatient Medications  Medication Sig Dispense Refill  . buPROPion (WELLBUTRIN XL) 150 MG 24 hr tablet Take two tablets every morning. 180 tablet 1  . carbamazepine (TEGRETOL) 200 MG tablet Take 2 tablets (400 mg total) by mouth at bedtime. 180 tablet 1  . fluticasone (FLONASE) 50 MCG/ACT nasal spray SPRAY 2 SPRAYS INTO EACH NOSTRIL EVERY DAY 48 mL 1  . montelukast (SINGULAIR) 10 MG tablet TAKE 1 TABLET BY MOUTH EVERYDAY AT BEDTIME 90 tablet 1  . omega-3 acid ethyl esters (LOVAZA) 1 g capsule Take 1 capsule (1 g total) by mouth 2 (two) times daily. 60 capsule 5  . risperiDONE (RISPERDAL) 0.5 MG tablet Take 1 tablet (0.5 mg total) by mouth at bedtime. 90 tablet 1  . risperiDONE (RISPERDAL) 1 MG tablet Take one tablet at bedtime. 90 tablet 1  . sodium chloride HYPERTONIC 3 % nebulizer solution Take by nebulization as needed for other or cough (congestion). (Patient not taking: Reported on 06/02/2019) 750 mL 12  . temazepam (RESTORIL) 30 MG capsule Take 1 capsule (30 mg total) by mouth at bedtime.  30 capsule 2  . VITAMIN D, CHOLECALCIFEROL, PO Take 1 capsule by mouth daily.      No current facility-administered medications for this visit.    Medication Side Effects: None  Allergies: No Known Allergies  Past Medical History:  Diagnosis Date  . Allergy   . Bipolar 1 disorder (HCC)     Family History  Problem Relation Age of Onset  . Cancer Neg Hx   . Other  Neg Hx        low testosterone    Social History   Socioeconomic History  . Marital status: Married    Spouse name: Not on file  . Number of children: Not on file  . Years of education: Not on file  . Highest education level: Not on file  Occupational History  . Not on file  Tobacco Use  . Smoking status: Current Every Day Smoker    Packs/day: 0.50    Types: Cigarettes, E-cigarettes  . Smokeless tobacco: Never Used  Substance and Sexual Activity  . Alcohol use: Yes  . Drug use: Not on file  . Sexual activity: Not on file  Other Topics Concern  . Not on file  Social History Narrative  . Not on file   Social Determinants of Health   Financial Resource Strain: Not on file  Food Insecurity: Not on file  Transportation Needs: Not on file  Physical Activity: Not on file  Stress: Not on file  Social Connections: Not on file  Intimate Partner Violence: Not on file    Past Medical History, Surgical history, Social history, and Family history were reviewed and updated as appropriate.   Please see review of systems for further details on the patient's review from today.   Objective:   Physical Exam:  There were no vitals taken for this visit.  Physical Exam Constitutional:      General: He is not in acute distress. Musculoskeletal:        General: No deformity.  Neurological:     Mental Status: He is alert and oriented to person, place, and time.     Coordination: Coordination normal.  Psychiatric:        Attention and Perception: Attention and perception normal. He does not perceive auditory or visual hallucinations.        Mood and Affect: Mood normal. Mood is not anxious or depressed. Affect is not labile, blunt, angry or inappropriate.        Speech: Speech normal.        Behavior: Behavior normal.        Thought Content: Thought content normal. Thought content is not paranoid or delusional. Thought content does not include homicidal or suicidal ideation. Thought  content does not include homicidal or suicidal plan.        Cognition and Memory: Cognition and memory normal.        Judgment: Judgment normal.     Comments: Insight intact     Lab Review:     Component Value Date/Time   NA 140 06/02/2019 0904   K 4.1 06/02/2019 0904   CL 103 06/02/2019 0904   CO2 31 06/02/2019 0904   GLUCOSE 89 06/02/2019 0904   BUN 10 06/02/2019 0904   CREATININE 0.85 06/02/2019 0904   CALCIUM 9.1 06/02/2019 0904   PROT 6.8 06/02/2019 0904   ALBUMIN 4.7 06/02/2019 0904   AST 14 06/02/2019 0904   ALT 17 06/02/2019 0904   ALKPHOS 99 06/02/2019 0904   BILITOT 0.5  06/02/2019 0904   GFRNONAA >60 04/26/2018 1021   GFRAA >60 04/26/2018 1021       Component Value Date/Time   WBC 4.1 06/02/2019 0904   RBC 4.67 06/02/2019 0904   HGB 15.0 06/02/2019 0904   HCT 43.9 06/02/2019 0904   PLT 146.0 (L) 06/02/2019 0904   MCV 94.1 06/02/2019 0904   MCH 31.0 04/26/2018 1021   MCHC 34.2 06/02/2019 0904   RDW 12.2 06/02/2019 0904   LYMPHSABS 1.4 04/26/2018 1021   MONOABS 0.7 04/26/2018 1021   EOSABS 0.1 04/26/2018 1021   BASOSABS 0.0 04/26/2018 1021    No results found for: POCLITH, LITHIUM   No results found for: PHENYTOIN, PHENOBARB, VALPROATE, CBMZ   .res Assessment: Plan:     Plan:  1. Wellbutrin XL 150mg  - 2 every morning 2. Temazepam 30mg  at hs 3. Carbamazepine 200mg  BID 4. Risperdal 1.5mg   mg at bedtime   RTC 3 months  Patient advised to contact office with any questions, adverse effects, or acute worsening in signs and symptoms.  Discussed potential metabolic side effects associated with atypical antipsychotics, as well as potential risk for movement side effects. Adcontact office if movement side effects occur.   Discussed potential benefits, risk, and side effects of benzodiazepines to include potential risk of tolerance and dependence, as well as possibvised pt to le drowsiness.  Advised patient not to drive if experiencing drowsiness and to  take lowest possible effective dose to minimize risk of dependence and tolerance.   Diagnoses and all orders for this visit:  Bipolar I disorder (HCC) -     carbamazepine (TEGRETOL) 200 MG tablet; Take 2 tablets (400 mg total) by mouth at bedtime. -     risperiDONE (RISPERDAL) 1 MG tablet; Take one tablet at bedtime. -     risperiDONE (RISPERDAL) 0.5 MG tablet; Take 1 tablet (0.5 mg total) by mouth at bedtime.  PTSD (post-traumatic stress disorder)  Insomnia, unspecified type -     temazepam (RESTORIL) 30 MG capsule; Take 1 capsule (30 mg total) by mouth at bedtime.  Major depressive disorder, recurrent episode, moderate (HCC) -     buPROPion (WELLBUTRIN XL) 150 MG 24 hr tablet; Take two tablets every morning.  Generalized anxiety disorder -     buPROPion (WELLBUTRIN XL) 150 MG 24 hr tablet; Take two tablets every morning.     Please see After Visit Summary for patient specific instructions.  Future Appointments  Date Time Provider Department Center  11/05/2020  7:30 AM Wend[place], , DO [facility]    No orders of the defined types were placed in this encounter.     -------------------------------

## 2020-09-12 IMAGING — MR MRI HEAD WITHOUT CONTRAST
9 of 11 series · 25 of 48 positions shown · IV contrast (Yes)
Comparison: None.

CLINICAL DATA: [place]

EXAM:
MRI HEAD WITHOUT CONTRAST
TECHNIQUE: Multiplanar, multiecho pulse sequences of the brain and surrounding
structures were obtained without intravenous contrast.

[Series 3: DWI · axial · 3.0mm · 1.09mm/px · z∈[-16,+156]mm · 5 of 118 slices shown (1 of 2)]
[im 1/118]
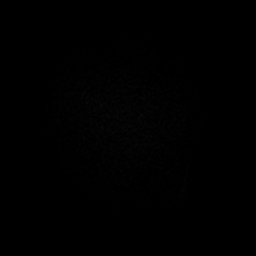
[im 30/118]
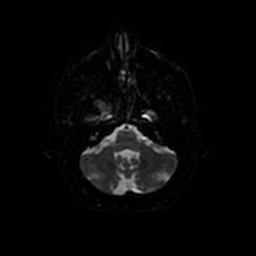
[im 59/118]
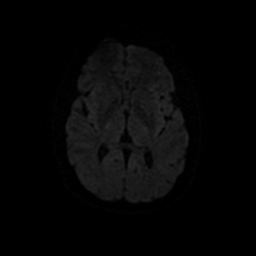
[im 88/118]
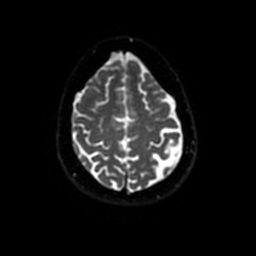
[im 118/118]
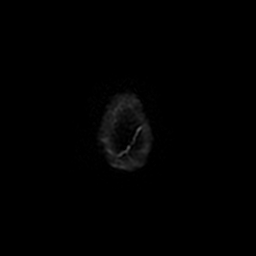

[Series 4: T1 · sagittal · 5.0mm · 0.47mm/px · 1 of 24 slices shown]
[im 1/24]
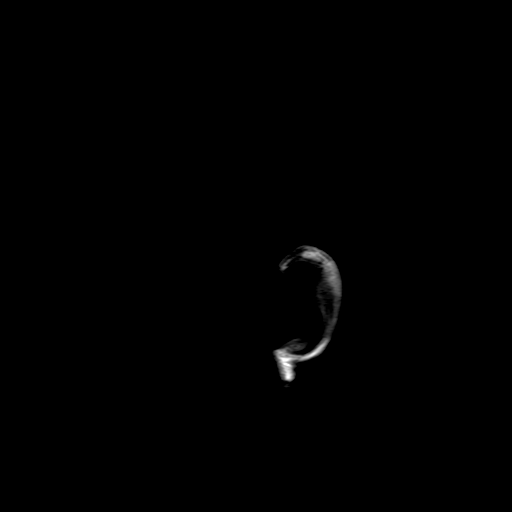

[Series 5: T2 · axial · 5.0mm · 0.43mm/px · 1 of 26 slices shown (1 of 2)]
[im 1/26]
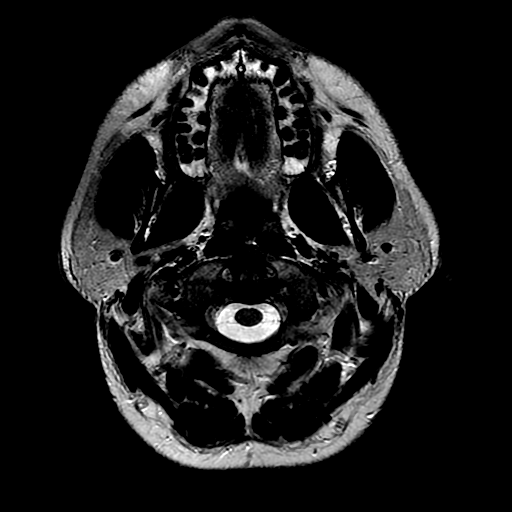

[Series 6: FLAIR · axial · 3.0mm · 0.43mm/px · 1 of 28 slices shown (1 of 2)]
[im 1/28]
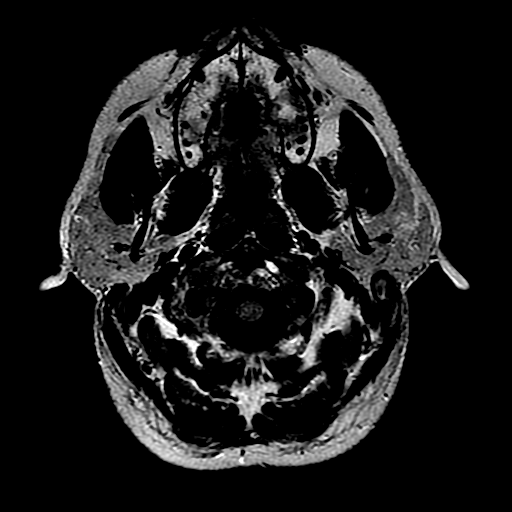

[Series 7: ax mpgr · axial · 3.0mm · 0.43mm/px · z∈[-21,+152]mm · 2 of 36 slices shown]
[im 1/36]
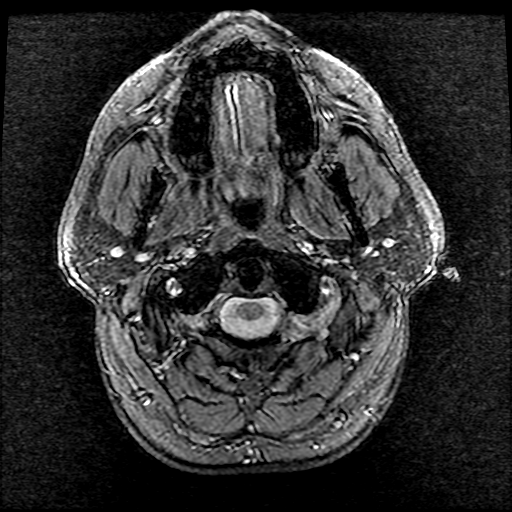
[im 36/36]
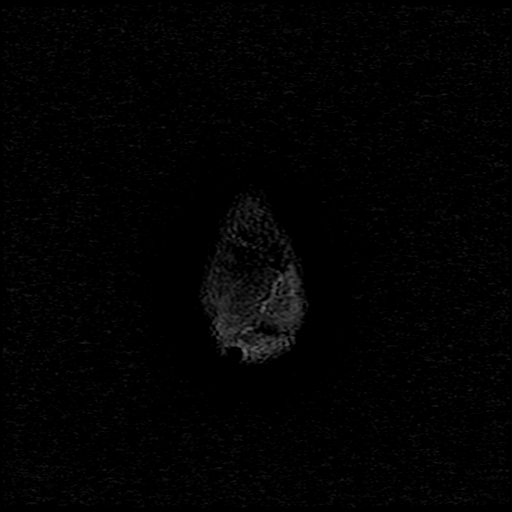

[Series 8: ax fspgr irp · axial · 1.0mm · 0.47mm/px · 1 of 178 slices shown]
[im 1/178]
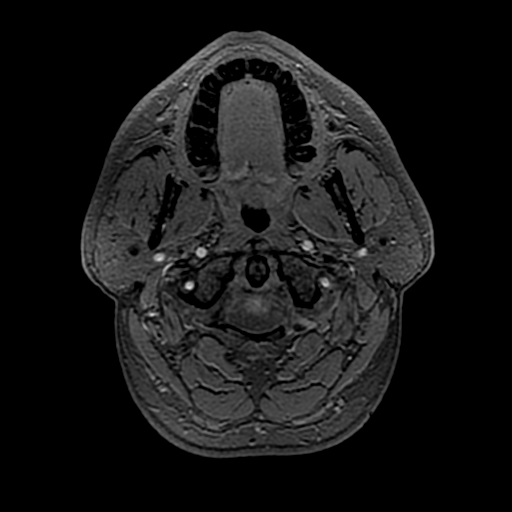

[Series 9: T2 · coronal · 5.0mm · 0.45mm/px · 2 of 32 slices shown (2 of 2)]
[im 1/32]
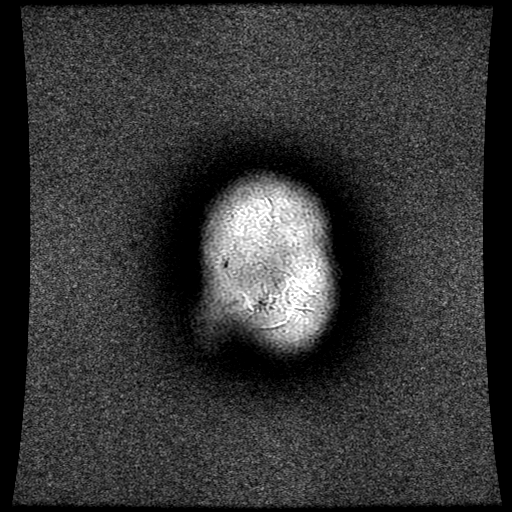
[im 32/32]
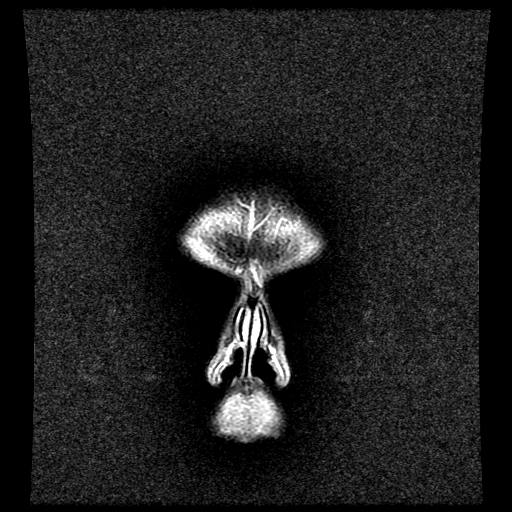

[Series 10: FLAIR · sagittal · 1.8mm · 0.43mm/px · 9 of 176 slices shown (2 of 2)]
[im 1/176]
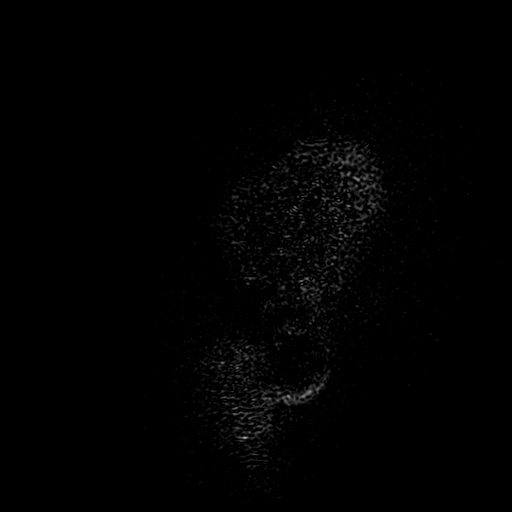
[im 22/176]
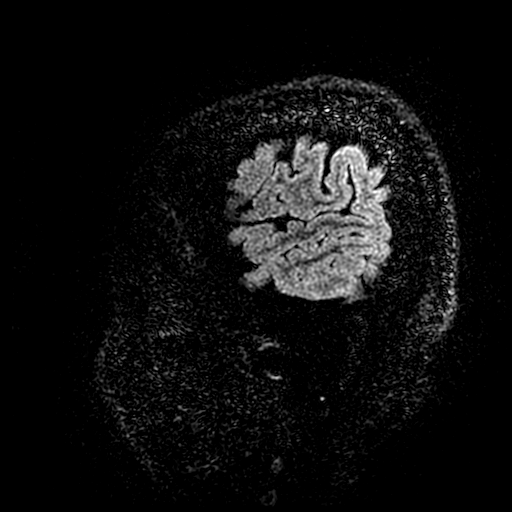
[im 44/176]
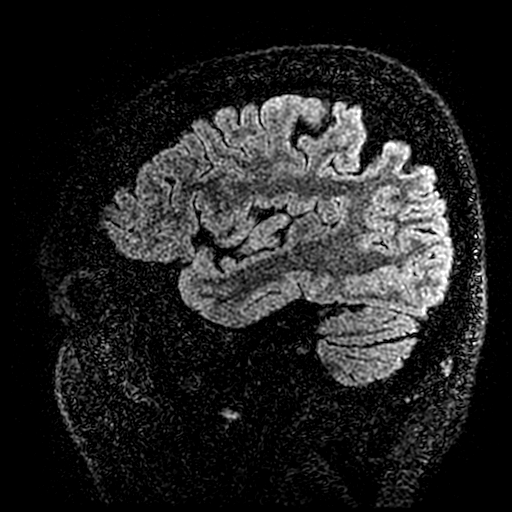
[im 66/176]
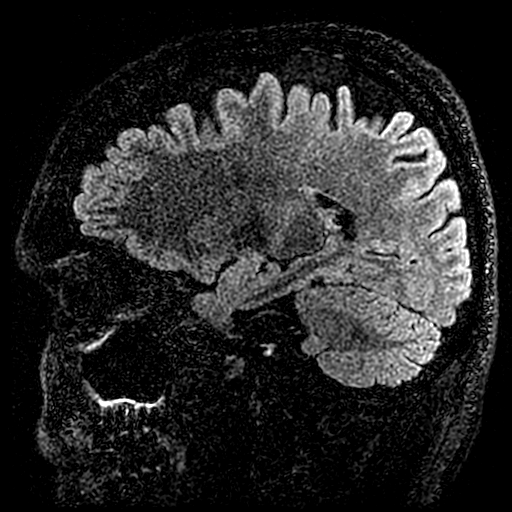
[im 88/176]
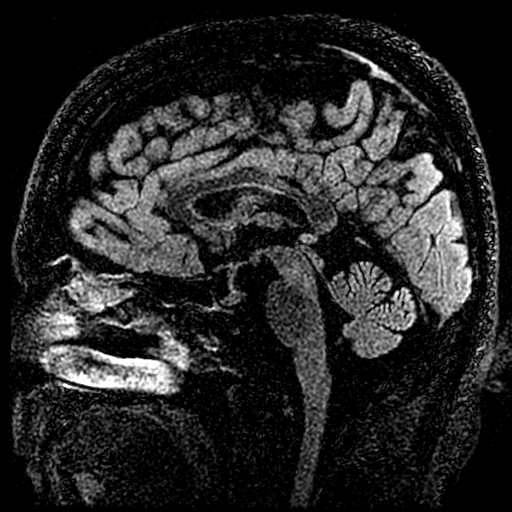
[im 110/176]
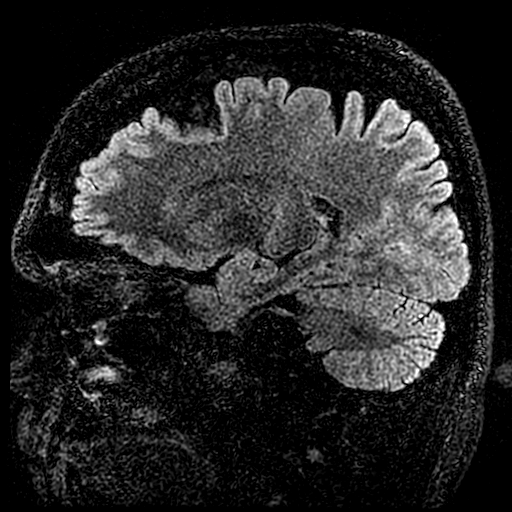
[im 132/176]
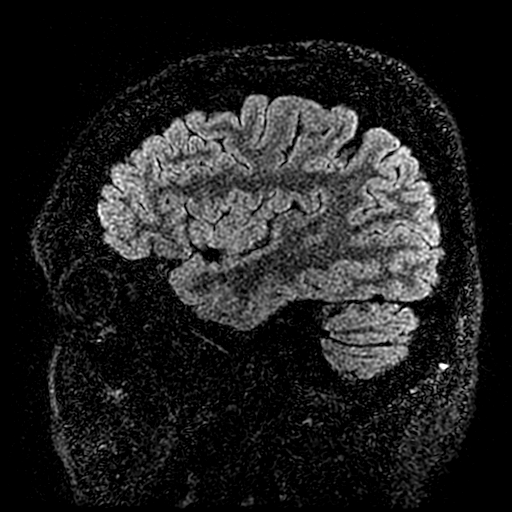
[im 154/176]
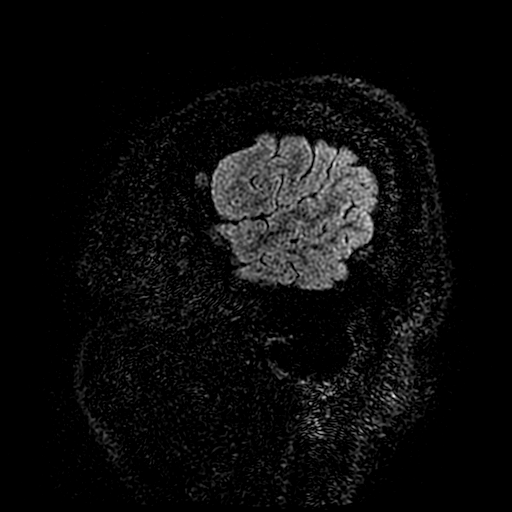
[im 176/176]
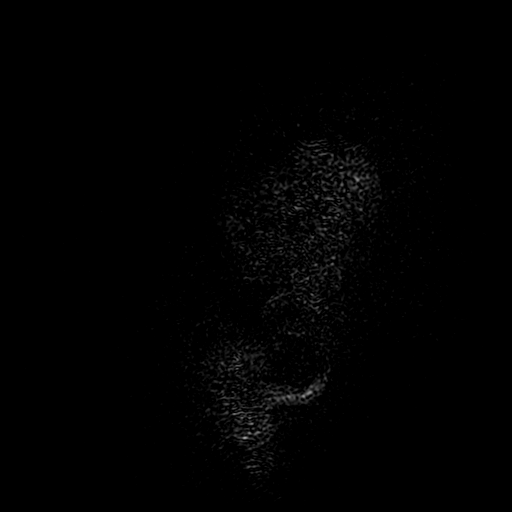

[Series 300: DWI · axial · 3.0mm · 1.09mm/px · z∈[-16,+156]mm · 3 of 59 slices shown (2 of 2)]
[im 1/59]
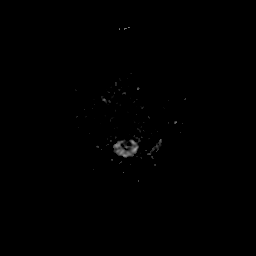
[im 30/59]
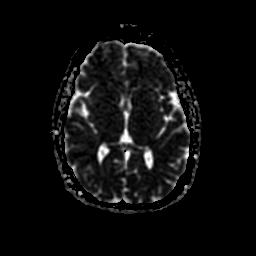
[im 59/59]
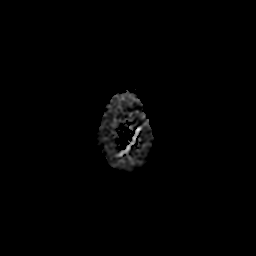

[25 of 48 positions shown; findings below may reference images not displayed]

FINDINGS: Brain: No acute infarction, hemorrhage, hydrocephalus, extra-axial
collection or mass lesion. Negative for demyelinating disease.

Vascular: Normal arterial flow voids

Skull and upper cervical spine: Negative

Sinuses/Orbits: Mild mucosal edema paranasal sinuses. Prior sinus
surgery. Normal orbit

Other: None
IMPRESSION: Negative MRI of the brain.  No evidence of demyelinating disease

Postsurgical changes in the sinus with diffuse mucosal thickening.

## 2020-09-19 ENCOUNTER — Ambulatory Visit: Payer: Federal, State, Local not specified - PPO | Admitting: Adult Health

## 2020-09-24 ENCOUNTER — Telehealth: Payer: Federal, State, Local not specified - PPO | Admitting: Adult Health

## 2020-10-08 ENCOUNTER — Encounter: Payer: Self-pay | Admitting: Adult Health

## 2020-10-08 ENCOUNTER — Telehealth (INDEPENDENT_AMBULATORY_CARE_PROVIDER_SITE_OTHER): Payer: Federal, State, Local not specified - PPO | Admitting: Adult Health

## 2020-10-08 DIAGNOSIS — F319 Bipolar disorder, unspecified: Secondary | ICD-10-CM | POA: Diagnosis not present

## 2020-10-08 DIAGNOSIS — F4310 Post-traumatic stress disorder, unspecified: Secondary | ICD-10-CM

## 2020-10-08 DIAGNOSIS — F411 Generalized anxiety disorder: Secondary | ICD-10-CM

## 2020-10-08 DIAGNOSIS — G4700 Insomnia, unspecified: Secondary | ICD-10-CM

## 2020-10-08 DIAGNOSIS — F331 Major depressive disorder, recurrent, moderate: Secondary | ICD-10-CM

## 2020-10-08 MED ORDER — TEMAZEPAM 30 MG PO CAPS
30.0000 mg | ORAL_CAPSULE | Freq: Every day | ORAL | 2 refills | Status: DC
Start: 2020-10-08 — End: 2021-01-06

## 2020-10-08 MED ORDER — RISPERIDONE 0.5 MG PO TABS: 0.5000 mg | ORAL_TABLET | Freq: Every day | ORAL | 1 refills | Status: DC

## 2020-10-08 NOTE — Progress Notes (Signed)
Christian Baldwin [id number] [birth year] 45 y.o.  Virtual Visit via Video Note  I connected with pt @ on 10/08/20 at  4:40 PM EDT by a video enabled telemedicine application and verified that I am speaking with the correct person using two identifiers.   I discussed the limitations of evaluation and management by telemedicine and the availability of in person appointments. The patient expressed understanding and agreed to proceed.  I discussed the assessment and treatment plan with the patient. The patient was provided an opportunity to ask questions and all were answered. The patient agreed with the plan and demonstrated an understanding of the instructions.   The patient was advised to call back or seek an in-person evaluation if the symptoms worsen or if the condition fails to improve as anticipated.  I provided 25 minutes of non-face-to-face time during this encounter.  The patient was located at home.  The provider was located at [facility].   Dorothyann Gibbs, NP   Subjective:   Patient ID:  Christian Baldwin is a 45 y.o. (DOB 1975/09/17) male.  Chief Complaint: No chief complaint on file.   HPI Christian Baldwin presents for follow-up of GAD, MDD, insomnia, BPD-1, and PTSD  Describes mood today as "ok". Pleasant. Mood symptoms - decreased depression, anxiety, and irritability. Mood remains level. Denies racing thoughts. Increased stress in work setting - "handling it without getting anxious". Stating "I'm doing pretty good". Feels like medications continue to work well and does not wish to make any changes today. Family doing well. Stable interest and motivation. Taking medications as prescribed.  Energy levels good - denies napping. Active, does not have a regular exercise routine.  Enjoys some usual interests and activities. Married. Lives with wife of 17 years and 2 children. Spending time with family.  Appetite adequate. Weight gain - 10 pounds - 199 pounds.   Sleeps well most nights. Averages 7.5 hours. Waking up 2 to 3 times a night and getting back to sleep. Focus and concentration stable. Completing tasks. Managing aspects of household. Works 40 hours a week. Denies SI or HI.  Denies AH or VH.    Past Psychiatric History: Admitted in March 2020 for mania after starting Zoloft.   Review of Systems:  Review of Systems  Musculoskeletal:  Negative for gait problem.  Neurological:  Negative for tremors.  Psychiatric/Behavioral:         Please refer to HPI   Medications: I have reviewed the patient's current medications.  Current Outpatient Medications  Medication Sig Dispense Refill   buPROPion (WELLBUTRIN XL) 150 MG 24 hr tablet Take two tablets every morning. 180 tablet 1   carbamazepine (TEGRETOL) 200 MG tablet Take 2 tablets (400 mg total) by mouth at bedtime. 180 tablet 1   fluticasone (FLONASE) 50 MCG/ACT nasal spray SPRAY 2 SPRAYS INTO EACH NOSTRIL EVERY DAY 48 mL 1   montelukast (SINGULAIR) 10 MG tablet TAKE 1 TABLET BY MOUTH EVERYDAY AT BEDTIME 90 tablet 1   omega-3 acid ethyl esters (LOVAZA) 1 g capsule Take 1 capsule (1 g total) by mouth 2 (two) times daily. 60 capsule 5   risperiDONE (RISPERDAL) 0.5 MG tablet Take 1 tablet (0.5 mg total) by mouth at bedtime. 90 tablet 1   risperiDONE (RISPERDAL) 1 MG tablet Take one tablet at bedtime. 90 tablet 1   sodium chloride HYPERTONIC 3 % nebulizer solution Take by nebulization as needed for other or cough (congestion). (Patient not taking: Reported on 06/02/2019) 750 mL 12   temazepam (  RESTORIL) 30 MG capsule Take 1 capsule (30 mg total) by mouth at bedtime. 30 capsule 2   VITAMIN D, CHOLECALCIFEROL, PO Take 1 capsule by mouth daily.      No current facility-administered medications for this visit.    Medication Side Effects: None  Allergies: No Known Allergies  Past Medical History:  Diagnosis Date   Allergy    Bipolar 1 disorder (HCC)     Family History  Problem Relation  Age of Onset   Cancer Neg Hx    Other Neg Hx        low testosterone    Social History   Socioeconomic History   Marital status: Married    Spouse name: Not on file   Number of children: Not on file   Years of education: Not on file   Highest education level: Not on file  Occupational History   Not on file  Tobacco Use   Smoking status: Every Day    Packs/day: 0.50    Types: Cigarettes, E-cigarettes   Smokeless tobacco: Never  Substance and Sexual Activity   Alcohol use: Yes   Drug use: Not on file   Sexual activity: Not on file  Other Topics Concern   Not on file  Social History Narrative   Not on file   Social Determinants of Health   Financial Resource Strain: Not on file  Food Insecurity: Not on file  Transportation Needs: Not on file  Physical Activity: Not on file  Stress: Not on file  Social Connections: Not on file  Intimate Partner Violence: Not on file    Past Medical History, Surgical history, Social history, and Family history were reviewed and updated as appropriate.   Please see review of systems for further details on the patient's review from today.   Objective:   Physical Exam:  There were no vitals taken for this visit.  Physical Exam Constitutional:      General: He is not in acute distress. Musculoskeletal:        General: No deformity.  Neurological:     Mental Status: He is alert and oriented to person, place, and time.     Coordination: Coordination normal.  Psychiatric:        Attention and Perception: Attention and perception normal. He does not perceive auditory or visual hallucinations.        Mood and Affect: Mood normal. Mood is not anxious or depressed. Affect is not labile, blunt, angry or inappropriate.        Speech: Speech normal.        Behavior: Behavior normal.        Thought Content: Thought content normal. Thought content is not paranoid or delusional. Thought content does not include homicidal or suicidal ideation.  Thought content does not include homicidal or suicidal plan.        Cognition and Memory: Cognition and memory normal.        Judgment: Judgment normal.     Comments: Insight intact    Lab Review:     Component Value Date/Time   NA 140 06/02/2019 0904   K 4.1 06/02/2019 0904   CL 103 06/02/2019 0904   CO2 31 06/02/2019 0904   GLUCOSE 89 06/02/2019 0904   BUN 10 06/02/2019 0904   CREATININE 0.85 06/02/2019 0904   CALCIUM 9.1 06/02/2019 0904   PROT 6.8 06/02/2019 0904   ALBUMIN 4.7 06/02/2019 0904   AST 14 06/02/2019 0904   ALT 17 06/02/2019 0904  ALKPHOS 99 06/02/2019 0904   BILITOT 0.5 06/02/2019 0904   GFRNONAA >60 04/26/2018 1021   GFRAA >60 04/26/2018 1021       Component Value Date/Time   WBC 4.1 06/02/2019 0904   RBC 4.67 06/02/2019 0904   HGB 15.0 06/02/2019 0904   HCT 43.9 06/02/2019 0904   PLT 146.0 (L) 06/02/2019 0904   MCV 94.1 06/02/2019 0904   MCH 31.0 04/26/2018 1021   MCHC 34.2 06/02/2019 0904   RDW 12.2 06/02/2019 0904   LYMPHSABS 1.4 04/26/2018 1021   MONOABS 0.7 04/26/2018 1021   EOSABS 0.1 04/26/2018 1021   BASOSABS 0.0 04/26/2018 1021    No results found for: POCLITH, LITHIUM   No results found for: PHENYTOIN, PHENOBARB, VALPROATE, CBMZ   .res Assessment: Plan:    Plan:  1. Wellbutrin XL 150mg  - 2 every morning 2. Temazepam 30mg  at hs 3. Carbamazepine 200mg  BID 4. Risperdal 1.5mg  at bedtime   RTC 3 months  Patient advised to contact office with any questions, adverse effects, or acute worsening in signs and symptoms.  Discussed potential metabolic side effects associated with atypical antipsychotics, as well as potential risk for movement side effects. Adcontact office if movement side effects occur.   Discussed potential benefits, risk, and side effects of benzodiazepines to include potential risk of tolerance and dependence, as well as possibvised pt to le drowsiness.  Advised patient not to drive if experiencing drowsiness and to  take lowest possible effective dose to minimize risk of dependence and tolerance.  Diagnoses and all orders for this visit:  Bipolar I disorder (HCC) -     risperiDONE (RISPERDAL) 0.5 MG tablet; Take 1 tablet (0.5 mg total) by mouth at bedtime.  Major depressive disorder, recurrent episode, moderate (HCC)  Generalized anxiety disorder  PTSD (post-traumatic stress disorder)  Insomnia, unspecified type -     temazepam (RESTORIL) 30 MG capsule; Take 1 capsule (30 mg total) by mouth at bedtime.    Please see After Visit Summary for patient specific instructions.  Future Appointments  Date Time Provider Department Center  11/05/2020  7:30 AM Wendling, , DO [facility]    No orders of the defined types were placed in this encounter.     -------------------------------

## 2020-11-05 ENCOUNTER — Encounter: Payer: Self-pay | Admitting: Family Medicine

## 2020-11-05 ENCOUNTER — Other Ambulatory Visit: Payer: Self-pay

## 2020-11-05 ENCOUNTER — Ambulatory Visit (INDEPENDENT_AMBULATORY_CARE_PROVIDER_SITE_OTHER): Payer: Federal, State, Local not specified - PPO | Admitting: Family Medicine

## 2020-11-05 VITALS — BP 108/78 | HR 86 | Temp 97.9°F | Ht 75.0 in | Wt 206.0 lb

## 2020-11-05 DIAGNOSIS — Z23 Encounter for immunization: Secondary | ICD-10-CM

## 2020-11-05 DIAGNOSIS — Z0000 Encounter for general adult medical examination without abnormal findings: Secondary | ICD-10-CM | POA: Diagnosis not present

## 2020-11-05 LAB — LIPID PANEL
Cholesterol: 192 mg/dL (ref 0–200)
HDL: 44.2 mg/dL (ref >39.00)
LDL Cholesterol: 113 mg/dL — ABNORMAL HIGH (ref 0–99)
NonHDL: 147.82
Total CHOL/HDL Ratio: 4
Triglycerides: 175 mg/dL — ABNORMAL HIGH (ref 0.0–149.0)
VLDL: 35 mg/dL (ref 0.0–40.0)

## 2020-11-05 LAB — COMPREHENSIVE METABOLIC PANEL
ALT: 32 U/L (ref 0–53)
AST: 17 U/L (ref 0–37)
Albumin: 4.6 g/dL (ref 3.5–5.2)
Alkaline Phosphatase: 102 U/L (ref 39–117)
BUN: 10 mg/dL (ref 6–23)
CO2: 29 mEq/L (ref 19–32)
Calcium: 9 mg/dL (ref 8.4–10.5)
Chloride: 104 mEq/L (ref 96–112)
Creatinine, Ser: 0.87 mg/dL (ref 0.40–1.50)
GFR: 105.75 mL/min (ref >60.00)
Glucose, Bld: 88 mg/dL (ref 70–99)
Potassium: 4.4 mEq/L (ref 3.5–5.1)
Sodium: 140 mEq/L (ref 135–145)
Total Bilirubin: 0.5 mg/dL (ref 0.2–1.2)
Total Protein: 6.9 g/dL (ref 6.0–8.3)

## 2020-11-05 LAB — CBC
HCT: 44.6 % (ref 39.0–52.0)
Hemoglobin: 15.2 g/dL (ref 13.0–17.0)
MCHC: 34.1 g/dL (ref 30.0–36.0)
MCV: 94.3 fl (ref 78.0–100.0)
Platelets: 137 10*3/uL — ABNORMAL LOW (ref 150.0–400.0)
RBC: 4.73 Mil/uL (ref 4.22–5.81)
RDW: 12.1 % (ref 11.5–15.5)
WBC: 4.9 10*3/uL (ref 4.0–10.5)

## 2020-11-05 MED ORDER — SILDENAFIL CITRATE 100 MG PO TABS: 50.0000 mg | ORAL_TABLET | Freq: Every day | ORAL | 2 refills | Status: DC | PRN

## 2020-11-05 NOTE — Addendum Note (Signed)
Addended by: Scharlene Gloss B on: 11/05/2020 07:47 AM   Modules accepted: Orders

## 2020-11-05 NOTE — Progress Notes (Signed)
Chief Complaint  Patient presents with   Annual Exam    Well Male Christian Baldwin is here for a complete physical.   His last physical was >1 year ago.  Current diet: in general, a "healthy" diet.   Current exercise: walking dogs Weight trend: stable overall Fatigue out of ordinary? No. Seatbelt? Yes  Health maintenance Tetanus- Yes HIV- Yes Hep C- Yes  Past Medical History:  Diagnosis Date   Allergy    Bipolar 1 disorder (HCC)      Past Surgical History:  Procedure Laterality Date   HERNIA REPAIR      Medications  Current Outpatient Medications on File Prior to Visit  Medication Sig Dispense Refill   buPROPion (WELLBUTRIN XL) 150 MG 24 hr tablet Take two tablets every morning. 180 tablet 1   carbamazepine (TEGRETOL) 200 MG tablet Take 2 tablets (400 mg total) by mouth at bedtime. 180 tablet 1   fluticasone (FLONASE) 50 MCG/ACT nasal spray SPRAY 2 SPRAYS INTO EACH NOSTRIL EVERY DAY 48 mL 1   montelukast (SINGULAIR) 10 MG tablet TAKE 1 TABLET BY MOUTH EVERYDAY AT BEDTIME 90 tablet 1   omega-3 acid ethyl esters (LOVAZA) 1 g capsule Take 1 capsule (1 g total) by mouth 2 (two) times daily. 60 capsule 5   risperiDONE (RISPERDAL) 0.5 MG tablet Take 1 tablet (0.5 mg total) by mouth at bedtime. 90 tablet 1   risperiDONE (RISPERDAL) 1 MG tablet Take one tablet at bedtime. 90 tablet 1   temazepam (RESTORIL) 30 MG capsule Take 1 capsule (30 mg total) by mouth at bedtime. 30 capsule 2   VITAMIN D, CHOLECALCIFEROL, PO Take 1 capsule by mouth daily.      Allergies No Known Allergies  Family History Family History  Problem Relation Age of Onset   Cancer Neg Hx    Other Neg Hx        low testosterone    Review of Systems: Constitutional: no fevers or chills Eye:  no recent significant change in vision Ear/Nose/Mouth/Throat:  Ears:  no hearing loss Nose/Mouth/Throat:  no complaints of nasal congestion, no sore throat Cardiovascular:  no chest pain Respiratory:  no  shortness of breath Gastrointestinal:  no abdominal pain, no change in bowel habits GU:  Male: negative for dysuria, frequency, and incontinence; +ED Musculoskeletal/Extremities:  no pain of the joints Integumentary (Skin/Breast):  no abnormal skin lesions reported Neurologic:  no headaches Endocrine: No unexpected weight changes Hematologic/Lymphatic:  no night sweats  Exam BP 108/78   Pulse 86   Temp 97.9 F (36.6 C) (Oral)   Ht 6\' 3"  (1.905 m)   Wt 206 lb (93.4 kg)   SpO2 99%   BMI 25.75 kg/m  General:  well developed, well nourished, in no apparent distress Skin:  no significant moles, warts, or growths Head:  no masses, lesions, or tenderness Eyes:  pupils equal and round, sclera anicteric without injection Ears:  canals without lesions, TMs shiny without retraction, no obvious effusion, no erythema Nose:  nares patent, septum midline, mucosa normal Throat/Pharynx:  lips and gingiva without lesion; tongue and uvula midline; non-inflamed pharynx; no exudates or postnasal drainage Neck: neck supple without adenopathy, thyromegaly, or masses Lungs:  clear to auscultation, breath sounds equal bilaterally, no respiratory distress Cardio:  regular rate and rhythm, no bruits, no LE edema Abdomen:  abdomen soft, nontender; bowel sounds normal; no masses or organomegaly Rectal: Deferred Musculoskeletal:  symmetrical muscle groups noted without atrophy or deformity Extremities:  no clubbing, cyanosis, or  edema, no deformities, no skin discoloration Neuro:  gait normal; deep tendon reflexes normal and symmetric Psych: well oriented with normal range of affect and appropriate judgment/insight  Assessment and Plan  Well adult exam - Plan: CBC, Comprehensive metabolic panel, Lipid panel   Well 45 y.o. male. Counseled on diet and exercise. Counseled on risks and benefits of prostate cancer screening with PSA. The patient agrees to forego screening.  Other orders as above. Follow up  in 1 yr pending the above workup. The patient voiced understanding and agreement to the plan.  Jilda Roche [place], DO 11/05/20 7:44 AM

## 2020-11-05 NOTE — Patient Instructions (Addendum)
Give [place] 2-3 business days to get the results of your labs back.   Keep the diet clean and stay active.  Use GoodRx for the sildenafil.   Let us know if you need anything.

## 2021-01-06 ENCOUNTER — Encounter: Payer: Self-pay | Admitting: Adult Health

## 2021-01-06 ENCOUNTER — Telehealth: Payer: Federal, State, Local not specified - PPO | Admitting: Adult Health

## 2021-01-06 DIAGNOSIS — F331 Major depressive disorder, recurrent, moderate: Secondary | ICD-10-CM

## 2021-01-06 DIAGNOSIS — F411 Generalized anxiety disorder: Secondary | ICD-10-CM

## 2021-01-06 DIAGNOSIS — F319 Bipolar disorder, unspecified: Secondary | ICD-10-CM | POA: Diagnosis not present

## 2021-01-06 DIAGNOSIS — F4310 Post-traumatic stress disorder, unspecified: Secondary | ICD-10-CM

## 2021-01-06 DIAGNOSIS — Z79899 Other long term (current) drug therapy: Secondary | ICD-10-CM | POA: Diagnosis not present

## 2021-01-06 DIAGNOSIS — G4700 Insomnia, unspecified: Secondary | ICD-10-CM

## 2021-01-06 MED ORDER — CARBAMAZEPINE 200 MG PO TABS: 400.0000 mg | ORAL_TABLET | Freq: Every day | ORAL | 1 refills | Status: DC

## 2021-01-06 MED ORDER — RISPERIDONE 1 MG PO TABS: ORAL_TABLET | ORAL | 1 refills | Status: DC

## 2021-01-06 MED ORDER — RISPERIDONE 0.5 MG PO TABS: 0.5000 mg | ORAL_TABLET | Freq: Every day | ORAL | 1 refills | Status: DC

## 2021-01-06 MED ORDER — BUPROPION HCL ER (XL) 150 MG PO TB24: ORAL_TABLET | ORAL | 1 refills | Status: DC

## 2021-01-06 MED ORDER — TEMAZEPAM 30 MG PO CAPS
30.0000 mg | ORAL_CAPSULE | Freq: Every day | ORAL | 2 refills | Status: DC
Start: 2021-01-06 — End: 2021-04-08

## 2021-01-06 NOTE — Progress Notes (Signed)
Christian Baldwin [id number] [birth year] 45 y.o.  Virtual Visit via Video Note  I connected with pt @ on 01/06/21 at  2:40 PM EST by a video enabled telemedicine application and verified that I am speaking with the correct person using two identifiers.   I discussed the limitations of evaluation and management by telemedicine and the availability of in person appointments. The patient expressed understanding and agreed to proceed.  I discussed the assessment and treatment plan with the patient. The patient was provided an opportunity to ask questions and all were answered. The patient agreed with the plan and demonstrated an understanding of the instructions.   The patient was advised to call back or seek an in-person evaluation if the symptoms worsen or if the condition fails to improve as anticipated.  I provided 25 minutes of non-face-to-face time during this encounter.  The patient was located at home.  The provider was located at [facility].   Dorothyann Gibbs, NP   Subjective:   Patient ID:  Christian Baldwin is a 45 y.o. (DOB 1976-01-19) male.  Chief Complaint: No chief complaint on file.   HPI Christian Baldwin presents for follow-up of GAD, MDD, insomnia, BPD-1, and PTSD  Describes mood today as "ok". Pleasant. Mood symptoms - decreased depression and irritability. Feels anxious at times  Reports some racing thoughts - "here and there" - "2 out of 10". Mood remains level. Stating "I feel like things are good". Noting some possible work stressors in the coming month. Feels like medications continue to work well. Family doing well. Stable interest and motivation. Taking medications as prescribed.  Energy levels good. Does not feel the need to nap. Active, does not have a regular exercise routine.  Enjoys some usual interests and activities. Married. Lives with wife of 17 years and 2 children. Spending time with family.  Appetite adequate. Weight stable - 200  pounds.  Sleeps well most nights. Averages 7.5 hours. Waking up some during the night, but able to get back to sleep. Focus and concentration stable. Completing tasks. Managing aspects of household. Works 40 hours a week. Denies SI or HI.  Denies AH or VH.    Past Psychiatric History: Admitted in March 2020 for mania after starting Zoloft.    Review of Systems:  Review of Systems  Musculoskeletal:  Negative for gait problem.  Neurological:  Negative for tremors.  Psychiatric/Behavioral:         Please refer to HPI   Medications: I have reviewed the patient's current medications.  Current Outpatient Medications  Medication Sig Dispense Refill   buPROPion (WELLBUTRIN XL) 150 MG 24 hr tablet Take two tablets every morning. 180 tablet 1   carbamazepine (TEGRETOL) 200 MG tablet Take 2 tablets (400 mg total) by mouth at bedtime. 180 tablet 1   fluticasone (FLONASE) 50 MCG/ACT nasal spray SPRAY 2 SPRAYS INTO EACH NOSTRIL EVERY DAY 48 mL 1   montelukast (SINGULAIR) 10 MG tablet TAKE 1 TABLET BY MOUTH EVERYDAY AT BEDTIME 90 tablet 1   omega-3 acid ethyl esters (LOVAZA) 1 g capsule Take 1 capsule (1 g total) by mouth 2 (two) times daily. 60 capsule 5   risperiDONE (RISPERDAL) 0.5 MG tablet Take 1 tablet (0.5 mg total) by mouth at bedtime. 90 tablet 1   risperiDONE (RISPERDAL) 1 MG tablet Take one tablet at bedtime. 90 tablet 1   sildenafil (VIAGRA) 100 MG tablet Take 0.5-1 tablets (50-100 mg total) by mouth daily as needed for erectile dysfunction. 30 tablet 2  temazepam (RESTORIL) 30 MG capsule Take 1 capsule (30 mg total) by mouth at bedtime. 30 capsule 2   VITAMIN D, CHOLECALCIFEROL, PO Take 1 capsule by mouth daily.      No current facility-administered medications for this visit.    Medication Side Effects: None  Allergies: Not on File  Past Medical History:  Diagnosis Date   Allergy    Bipolar 1 disorder (HCC)     Family History  Problem Relation Age of Onset   Cancer Neg  Hx    Other Neg Hx        low testosterone    Social History   Socioeconomic History   Marital status: Married    Spouse name: Not on file   Number of children: Not on file   Years of education: Not on file   Highest education level: Not on file  Occupational History   Not on file  Tobacco Use   Smoking status: Every Day    Packs/day: 0.50    Types: Cigarettes, E-cigarettes   Smokeless tobacco: Never  Substance and Sexual Activity   Alcohol use: Yes   Drug use: Not on file   Sexual activity: Not on file  Other Topics Concern   Not on file  Social History Narrative   Not on file   Social Determinants of Health   Financial Resource Strain: Not on file  Food Insecurity: Not on file  Transportation Needs: Not on file  Physical Activity: Not on file  Stress: Not on file  Social Connections: Not on file  Intimate Partner Violence: Not on file    Past Medical History, Surgical history, Social history, and Family history were reviewed and updated as appropriate.   Please see review of systems for further details on the patient's review from today.   Objective:   Physical Exam:  There were no vitals taken for this visit.  Physical Exam Constitutional:      General: He is not in acute distress. Musculoskeletal:        General: No deformity.  Neurological:     Mental Status: He is alert and oriented to person, place, and time.     Coordination: Coordination normal.  Psychiatric:        Attention and Perception: Attention and perception normal. He does not perceive auditory or visual hallucinations.        Mood and Affect: Mood normal. Mood is not anxious or depressed. Affect is not labile, blunt, angry or inappropriate.        Speech: Speech normal.        Behavior: Behavior normal.        Thought Content: Thought content normal. Thought content is not paranoid or delusional. Thought content does not include homicidal or suicidal ideation. Thought content does not  include homicidal or suicidal plan.        Cognition and Memory: Cognition and memory normal.        Judgment: Judgment normal.     Comments: Insight intact    Lab Review:     Component Value Date/Time   NA 140 11/05/2020 0745   K 4.4 11/05/2020 0745   CL 104 11/05/2020 0745   CO2 29 11/05/2020 0745   GLUCOSE 88 11/05/2020 0745   BUN 10 11/05/2020 0745   CREATININE 0.87 11/05/2020 0745   CALCIUM 9.0 11/05/2020 0745   PROT 6.9 11/05/2020 0745   ALBUMIN 4.6 11/05/2020 0745   AST 17 11/05/2020 0745   ALT 32 11/05/2020  0745   ALKPHOS 102 11/05/2020 0745   BILITOT 0.5 11/05/2020 0745   GFRNONAA >60 04/26/2018 1021   GFRAA >60 04/26/2018 1021       Component Value Date/Time   WBC 4.9 11/05/2020 0745   RBC 4.73 11/05/2020 0745   HGB 15.2 11/05/2020 0745   HCT 44.6 11/05/2020 0745   PLT 137.0 (L) 11/05/2020 0745   MCV 94.3 11/05/2020 0745   MCH 31.0 04/26/2018 1021   MCHC 34.1 11/05/2020 0745   RDW 12.1 11/05/2020 0745   LYMPHSABS 1.4 04/26/2018 1021   MONOABS 0.7 04/26/2018 1021   EOSABS 0.1 04/26/2018 1021   BASOSABS 0.0 04/26/2018 1021    No results found for: POCLITH, LITHIUM   No results found for: PHENYTOIN, PHENOBARB, VALPROATE, CBMZ   .res Assessment: Plan:     Plan:  1. Wellbutrin XL 150mg  - 2 every morning 2. Temazepam 30mg  at hs 3. Carbamazepine 200mg  BID - Sodium level 140 11/07/2020 4. Risperdal 1.5mg  at bedtime   Tegretol level - discussed.  RTC 3 months  Patient advised to contact office with any questions, adverse effects, or acute worsening in signs and symptoms.  Discussed potential metabolic side effects associated with atypical antipsychotics, as well as potential risk for movement side effects. Adcontact office if movement side effects occur.   Discussed potential benefits, risk, and side effects of benzodiazepines to include potential risk of tolerance and dependence, as well as possibvised pt to le drowsiness.  Advised patient not to  drive if experiencing drowsiness and to take lowest possible effective dose to minimize risk of dependence and tolerance.  Diagnoses and all orders for this visit:  High risk medication use -     Carbamazepine level, total  Bipolar I disorder (HCC) -     carbamazepine (TEGRETOL) 200 MG tablet; Take 2 tablets (400 mg total) by mouth at bedtime. -     risperiDONE (RISPERDAL) 0.5 MG tablet; Take 1 tablet (0.5 mg total) by mouth at bedtime. -     risperiDONE (RISPERDAL) 1 MG tablet; Take one tablet at bedtime.  Generalized anxiety disorder -     buPROPion (WELLBUTRIN XL) 150 MG 24 hr tablet; Take two tablets every morning.  PTSD (post-traumatic stress disorder)  Insomnia, unspecified type -     temazepam (RESTORIL) 30 MG capsule; Take 1 capsule (30 mg total) by mouth at bedtime.  Major depressive disorder, recurrent episode, moderate (HCC) -     buPROPion (WELLBUTRIN XL) 150 MG 24 hr tablet; Take two tablets every morning.    Please see After Visit Summary for patient specific instructions.  Future Appointments  Date Time Provider Department Center  11/07/2021  7:30 AM Wendling, , DO [facility]    Orders Placed This Encounter  Procedures   Carbamazepine level, total      -------------------------------

## 2021-01-10 LAB — CARBAMAZEPINE LEVEL, TOTAL: Carbamazepine (Tegretol), S: 6.3 ug/mL (ref 4.0–12.0)

## 2021-04-08 ENCOUNTER — Encounter: Payer: Self-pay | Admitting: Adult Health

## 2021-04-08 ENCOUNTER — Telehealth: Payer: Federal, State, Local not specified - PPO | Admitting: Adult Health

## 2021-04-08 DIAGNOSIS — G4700 Insomnia, unspecified: Secondary | ICD-10-CM

## 2021-04-08 DIAGNOSIS — F319 Bipolar disorder, unspecified: Secondary | ICD-10-CM

## 2021-04-08 DIAGNOSIS — F331 Major depressive disorder, recurrent, moderate: Secondary | ICD-10-CM

## 2021-04-08 DIAGNOSIS — F4310 Post-traumatic stress disorder, unspecified: Secondary | ICD-10-CM

## 2021-04-08 DIAGNOSIS — F411 Generalized anxiety disorder: Secondary | ICD-10-CM | POA: Diagnosis not present

## 2021-04-08 MED ORDER — TEMAZEPAM 30 MG PO CAPS: 30.0000 mg | ORAL_CAPSULE | Freq: Every day | ORAL | 2 refills | Status: DC

## 2021-04-08 NOTE — Progress Notes (Signed)
Christian Baldwin [id number] 1975-12-18 46 y.o.  Virtual Visit via Video Note  I connected with pt @ on 04/08/21 at  2:40 PM EST by a video enabled telemedicine application and verified that I am speaking with the correct person using two identifiers.   I discussed the limitations of evaluation and management by telemedicine and the availability of in person appointments. The patient expressed understanding and agreed to proceed.  I discussed the assessment and treatment plan with the patient. The patient was provided an opportunity to ask questions and all were answered. The patient agreed with the plan and demonstrated an understanding of the instructions.   The patient was advised to call back or seek an in-person evaluation if the symptoms worsen or if the condition fails to improve as anticipated.  I provided 25 minutes of non-face-to-face time during this encounter.  The patient was located at home.  The provider was located at [facility].   Dorothyann Gibbs, NP   Subjective:   Patient ID:  Christian Baldwin is a 46 y.o. (DOB Sep 16, 1975) male.  Chief Complaint: No chief complaint on file.   HPI Christian Baldwin presents for follow-up of GAD, MDD, insomnia, BPD-1, and PTSD  Describes mood today as "ok". Pleasant. Denies tearfulness. Mood symptoms - denies depression, anxiety or irritability. Denies racing thoughts. Mood has been stable. Stating "I'm doing alright". Feels like medications continue to work well. Family doing well. Stable interest and motivation. Taking medications as prescribed.  Energy levels good. Active, does not have a regular exercise routine.  Enjoys some usual interests and activities. Married. Lives with wife and 2 children. Spending time with family.  Appetite adequate. Weight stable - 200 pounds.  Sleeps well most nights. Averages 7.5 hours.  Focus and concentration stable. Completing tasks. Managing aspects of household. Works 40 hours  a week. Denies SI or HI.  Denies AH or VH.    Past Psychiatric History: Admitted in March 2020 for mania after starting Zoloft.    Review of Systems:  Review of Systems  Musculoskeletal:  Negative for gait problem.  Neurological:  Negative for tremors.  Psychiatric/Behavioral:         Please refer to HPI   Medications: I have reviewed the patient's current medications.  Current Outpatient Medications  Medication Sig Dispense Refill   buPROPion (WELLBUTRIN XL) 150 MG 24 hr tablet Take two tablets every morning. 180 tablet 1   carbamazepine (TEGRETOL) 200 MG tablet Take 2 tablets (400 mg total) by mouth at bedtime. 180 tablet 1   fluticasone (FLONASE) 50 MCG/ACT nasal spray SPRAY 2 SPRAYS INTO EACH NOSTRIL EVERY DAY 48 mL 1   montelukast (SINGULAIR) 10 MG tablet TAKE 1 TABLET BY MOUTH EVERYDAY AT BEDTIME 90 tablet 1   omega-3 acid ethyl esters (LOVAZA) 1 g capsule Take 1 capsule (1 g total) by mouth 2 (two) times daily. 60 capsule 5   risperiDONE (RISPERDAL) 0.5 MG tablet Take 1 tablet (0.5 mg total) by mouth at bedtime. 90 tablet 1   risperiDONE (RISPERDAL) 1 MG tablet Take one tablet at bedtime. 90 tablet 1   sildenafil (VIAGRA) 100 MG tablet Take 0.5-1 tablets (50-100 mg total) by mouth daily as needed for erectile dysfunction. 30 tablet 2   temazepam (RESTORIL) 30 MG capsule Take 1 capsule (30 mg total) by mouth at bedtime. 30 capsule 2   VITAMIN D, CHOLECALCIFEROL, PO Take 1 capsule by mouth daily.      No current facility-administered medications for this visit.  Medication Side Effects: None  Allergies: Not on File  Past Medical History:  Diagnosis Date   Allergy    Bipolar 1 disorder (HCC)     Family History  Problem Relation Age of Onset   Cancer Neg Hx    Other Neg Hx        low testosterone    Social History   Socioeconomic History   Marital status: Married    Spouse name: Not on file   Number of children: Not on file   Years of education: Not on  file   Highest education level: Not on file  Occupational History   Not on file  Tobacco Use   Smoking status: Every Day    Packs/day: 0.50    Types: Cigarettes, E-cigarettes   Smokeless tobacco: Never  Substance and Sexual Activity   Alcohol use: Yes   Drug use: Not on file   Sexual activity: Not on file  Other Topics Concern   Not on file  Social History Narrative   Not on file   Social Determinants of Health   Financial Resource Strain: Not on file  Food Insecurity: Not on file  Transportation Needs: Not on file  Physical Activity: Not on file  Stress: Not on file  Social Connections: Not on file  Intimate Partner Violence: Not on file    Past Medical History, Surgical history, Social history, and Family history were reviewed and updated as appropriate.   Please see review of systems for further details on the patient's review from today.   Objective:   Physical Exam:  There were no vitals taken for this visit.  Physical Exam Constitutional:      General: He is not in acute distress. Musculoskeletal:        General: No deformity.  Neurological:     Mental Status: He is alert and oriented to person, place, and time.     Coordination: Coordination normal.  Psychiatric:        Attention and Perception: Attention and perception normal. He does not perceive auditory or visual hallucinations.        Mood and Affect: Mood normal. Mood is not anxious or depressed. Affect is not labile, blunt, angry or inappropriate.        Speech: Speech normal.        Behavior: Behavior normal.        Thought Content: Thought content normal. Thought content is not paranoid or delusional. Thought content does not include homicidal or suicidal ideation. Thought content does not include homicidal or suicidal plan.        Cognition and Memory: Cognition and memory normal.        Judgment: Judgment normal.     Comments: Insight intact    Lab Review:     Component Value Date/Time    NA 140 11/05/2020 0745   K 4.4 11/05/2020 0745   CL 104 11/05/2020 0745   CO2 29 11/05/2020 0745   GLUCOSE 88 11/05/2020 0745   BUN 10 11/05/2020 0745   CREATININE 0.87 11/05/2020 0745   CALCIUM 9.0 11/05/2020 0745   PROT 6.9 11/05/2020 0745   ALBUMIN 4.6 11/05/2020 0745   AST 17 11/05/2020 0745   ALT 32 11/05/2020 0745   ALKPHOS 102 11/05/2020 0745   BILITOT 0.5 11/05/2020 0745   GFRNONAA >60 04/26/2018 1021   GFRAA >60 04/26/2018 1021       Component Value Date/Time   WBC 4.9 11/05/2020 0745   RBC 4.73 11/05/2020  0745   HGB 15.2 11/05/2020 0745   HCT 44.6 11/05/2020 0745   PLT 137.0 (L) 11/05/2020 0745   MCV 94.3 11/05/2020 0745   MCH 31.0 04/26/2018 1021   MCHC 34.1 11/05/2020 0745   RDW 12.1 11/05/2020 0745   LYMPHSABS 1.4 04/26/2018 1021   MONOABS 0.7 04/26/2018 1021   EOSABS 0.1 04/26/2018 1021   BASOSABS 0.0 04/26/2018 1021    No results found for: POCLITH, LITHIUM   Lab Results  Component Value Date   CBMZ 6.3 01/08/2021     .res Assessment: Plan:    Plan:  1. Wellbutrin XL 150mg  - 2 every morning 2. Temazepam 30mg  at hs 3. Carbamazepine 200mg  BID - Sodium level 140 11/07/2020 4. Risperdal 1.5mg  at bedtime   Tegretol level - discussed.  RTC 3 months  Patient advised to contact office with any questions, adverse effects, or acute worsening in signs and symptoms.  Discussed potential metabolic side effects associated with atypical antipsychotics, as well as potential risk for movement side effects. Adcontact office if movement side effects occur.   Discussed potential benefits, risk, and side effects of benzodiazepines to include potential risk of tolerance and dependence, as well as possibvised pt to le drowsiness.  Advised patient not to drive if experiencing drowsiness and to take lowest possible effective dose to minimize risk of dependence and tolerance. There are no diagnoses linked to this encounter.   Please see After Visit Summary for  patient specific instructions.  Future Appointments  Date Time Provider Department Center  04/08/2021  2:40 PM Sojourner Behringer, , NP [facility] None  11/07/2021  7:30 AM 04/10/2021, Thereasa Solo, DO [facility]    No orders of the defined types were placed in this encounter.     -------------------------------

## 2021-07-02 ENCOUNTER — Encounter: Payer: Self-pay | Admitting: Adult Health

## 2021-07-02 ENCOUNTER — Telehealth: Payer: Federal, State, Local not specified - PPO | Admitting: Adult Health

## 2021-07-02 DIAGNOSIS — G4700 Insomnia, unspecified: Secondary | ICD-10-CM

## 2021-07-02 DIAGNOSIS — F411 Generalized anxiety disorder: Secondary | ICD-10-CM | POA: Diagnosis not present

## 2021-07-02 DIAGNOSIS — F331 Major depressive disorder, recurrent, moderate: Secondary | ICD-10-CM | POA: Diagnosis not present

## 2021-07-02 DIAGNOSIS — F319 Bipolar disorder, unspecified: Secondary | ICD-10-CM | POA: Diagnosis not present

## 2021-07-02 DIAGNOSIS — F4310 Post-traumatic stress disorder, unspecified: Secondary | ICD-10-CM

## 2021-07-02 MED ORDER — CARBAMAZEPINE 200 MG PO TABS: 400.0000 mg | ORAL_TABLET | Freq: Every day | ORAL | 1 refills | Status: DC

## 2021-07-02 MED ORDER — RISPERIDONE 1 MG PO TABS: ORAL_TABLET | ORAL | 1 refills | Status: DC

## 2021-07-02 MED ORDER — TEMAZEPAM 30 MG PO CAPS: 30.0000 mg | ORAL_CAPSULE | Freq: Every day | ORAL | 2 refills | Status: DC

## 2021-07-02 MED ORDER — BUPROPION HCL ER (XL) 150 MG PO TB24: ORAL_TABLET | ORAL | 1 refills | Status: DC

## 2021-07-02 MED ORDER — RISPERIDONE 0.5 MG PO TABS: 0.5000 mg | ORAL_TABLET | Freq: Every day | ORAL | 1 refills | Status: DC

## 2021-07-02 NOTE — Progress Notes (Signed)
Andrw Baldwin [id number] 04-19-1975 46 y.o.  Virtual Visit via Video Note  I connected with pt @ on 07/02/21 at  2:40 PM EDT by a video enabled telemedicine application and verified that I am speaking with the correct person using two identifiers.   I discussed the limitations of evaluation and management by telemedicine and the availability of in person appointments. The patient expressed understanding and agreed to proceed.  I discussed the assessment and treatment plan with the patient. The patient was provided an opportunity to ask questions and all were answered. The patient agreed with the plan and demonstrated an understanding of the instructions.   The patient was advised to call back or seek an in-person evaluation if the symptoms worsen or if the condition fails to improve as anticipated.  I provided 25 minutes of non-face-to-face time during this encounter.  The patient was located at home.  The provider was located at [facility].   Dorothyann Gibbs, NP   Subjective:   Patient ID:  Christian Baldwin is a 46 y.o. (DOB 09-11-1975) male.  Chief Complaint: No chief complaint on file.   HPI Ajax Schroll presents for follow-up of GAD, MDD, insomnia, BPD-1, and PTSD  Describes mood today as "ok". Pleasant. Denies tearfulness. Mood symptoms - denies depression, anxiety or irritability. Denies racing thoughts. Mood is consistent. Stating "I feel like I'm doing alright". Feels like medications continue to work well. Family doing well. Stable interest and motivation. Taking medications as prescribed.  Energy levels good "75%". Active, does not have a regular exercise routine. Walking some days. Enjoys some usual interests and activities. Married. Lives with wife and 2 children. Spending time with family.  Appetite adequate. Weight stable - 200 pounds.  Sleeps well most nights. Averages 7.5 hours.  Focus and concentration stable. Completing tasks. Managing  aspects of household. Works 40 hours a week. Denies SI or HI.  Denies AH or VH.    Past Psychiatric History: Admitted in March 2020 for mania after starting Zoloft.    Review of Systems:  Review of Systems  Musculoskeletal:  Negative for gait problem.  Neurological:  Negative for tremors.  Psychiatric/Behavioral:         Please refer to HPI   Medications: I have reviewed the patient's current medications.  Current Outpatient Medications  Medication Sig Dispense Refill   buPROPion (WELLBUTRIN XL) 150 MG 24 hr tablet Take two tablets every morning. 180 tablet 1   carbamazepine (TEGRETOL) 200 MG tablet Take 2 tablets (400 mg total) by mouth at bedtime. 180 tablet 1   fluticasone (FLONASE) 50 MCG/ACT nasal spray SPRAY 2 SPRAYS INTO EACH NOSTRIL EVERY DAY 48 mL 1   montelukast (SINGULAIR) 10 MG tablet TAKE 1 TABLET BY MOUTH EVERYDAY AT BEDTIME 90 tablet 1   omega-3 acid ethyl esters (LOVAZA) 1 g capsule Take 1 capsule (1 g total) by mouth 2 (two) times daily. 60 capsule 5   risperiDONE (RISPERDAL) 0.5 MG tablet Take 1 tablet (0.5 mg total) by mouth at bedtime. 90 tablet 1   risperiDONE (RISPERDAL) 1 MG tablet Take one tablet at bedtime. 90 tablet 1   sildenafil (VIAGRA) 100 MG tablet Take 0.5-1 tablets (50-100 mg total) by mouth daily as needed for erectile dysfunction. 30 tablet 2   temazepam (RESTORIL) 30 MG capsule Take 1 capsule (30 mg total) by mouth at bedtime. 30 capsule 2   VITAMIN D, CHOLECALCIFEROL, PO Take 1 capsule by mouth daily.      No current facility-administered  medications for this visit.    Medication Side Effects: None  Allergies: No Known Allergies  Past Medical History:  Diagnosis Date   Allergy    Bipolar 1 disorder (HCC)     Family History  Problem Relation Age of Onset   Cancer Neg Hx    Other Neg Hx        low testosterone    Social History   Socioeconomic History   Marital status: Married    Spouse name: Not on file   Number of children:  Not on file   Years of education: Not on file   Highest education level: Not on file  Occupational History   Not on file  Tobacco Use   Smoking status: Every Day    Packs/day: 0.50    Types: Cigarettes, E-cigarettes   Smokeless tobacco: Never  Substance and Sexual Activity   Alcohol use: Yes   Drug use: Not on file   Sexual activity: Not on file  Other Topics Concern   Not on file  Social History Narrative   Not on file   Social Determinants of Health   Financial Resource Strain: Not on file  Food Insecurity: Not on file  Transportation Needs: Not on file  Physical Activity: Not on file  Stress: Not on file  Social Connections: Not on file  Intimate Partner Violence: Not on file    Past Medical History, Surgical history, Social history, and Family history were reviewed and updated as appropriate.   Please see review of systems for further details on the patient's review from today.   Objective:   Physical Exam:  There were no vitals taken for this visit.  Physical Exam Constitutional:      General: He is not in acute distress. Musculoskeletal:        General: No deformity.  Neurological:     Mental Status: He is alert and oriented to person, place, and time.     Coordination: Coordination normal.  Psychiatric:        Attention and Perception: Attention and perception normal. He does not perceive auditory or visual hallucinations.        Mood and Affect: Mood normal. Mood is not anxious or depressed. Affect is not labile, blunt, angry or inappropriate.        Speech: Speech normal.        Behavior: Behavior normal.        Thought Content: Thought content normal. Thought content is not paranoid or delusional. Thought content does not include homicidal or suicidal ideation. Thought content does not include homicidal or suicidal plan.        Cognition and Memory: Cognition and memory normal.        Judgment: Judgment normal.     Comments: Insight intact    Lab  Review:     Component Value Date/Time   NA 140 11/05/2020 0745   K 4.4 11/05/2020 0745   CL 104 11/05/2020 0745   CO2 29 11/05/2020 0745   GLUCOSE 88 11/05/2020 0745   BUN 10 11/05/2020 0745   CREATININE 0.87 11/05/2020 0745   CALCIUM 9.0 11/05/2020 0745   PROT 6.9 11/05/2020 0745   ALBUMIN 4.6 11/05/2020 0745   AST 17 11/05/2020 0745   ALT 32 11/05/2020 0745   ALKPHOS 102 11/05/2020 0745   BILITOT 0.5 11/05/2020 0745   GFRNONAA >60 04/26/2018 1021   GFRAA >60 04/26/2018 1021       Component Value Date/Time   WBC 4.9  11/05/2020 0745   RBC 4.73 11/05/2020 0745   HGB 15.2 11/05/2020 0745   HCT 44.6 11/05/2020 0745   PLT 137.0 (L) 11/05/2020 0745   MCV 94.3 11/05/2020 0745   MCH 31.0 04/26/2018 1021   MCHC 34.1 11/05/2020 0745   RDW 12.1 11/05/2020 0745   LYMPHSABS 1.4 04/26/2018 1021   MONOABS 0.7 04/26/2018 1021   EOSABS 0.1 04/26/2018 1021   BASOSABS 0.0 04/26/2018 1021    No results found for: POCLITH, LITHIUM   Lab Results  Component Value Date   CBMZ 6.3 01/08/2021     .res Assessment: Plan:    Plan:  1. Wellbutrin XL 150mg  - 2 every morning 2. Temazepam 30mg  at hs 3. Carbamazepine 200mg  BID - Sodium level 140 11/07/2020 4. Risperdal 1.5mg  at bedtime   Tegretol level - discussed.  RTC 3 months  Patient advised to contact office with any questions, adverse effects, or acute worsening in signs and symptoms.  Discussed potential metabolic side effects associated with atypical antipsychotics, as well as potential risk for movement side effects. Adcontact office if movement side effects occur.   Discussed potential benefits, risk, and side effects of benzodiazepines to include potential risk of tolerance and dependence, as well as possibvised pt to le drowsiness.  Advised patient not to drive if experiencing drowsiness and to take lowest possible effective dose to minimize risk of dependence and tolerance. There are no diagnoses linked to this encounter.   Diagnoses and all orders for this visit:  Insomnia, unspecified type -     temazepam (RESTORIL) 30 MG capsule; Take 1 capsule (30 mg total) by mouth at bedtime.  Bipolar I disorder (HCC) -     risperiDONE (RISPERDAL) 1 MG tablet; Take one tablet at bedtime. -     risperiDONE (RISPERDAL) 0.5 MG tablet; Take 1 tablet (0.5 mg total) by mouth at bedtime. -     carbamazepine (TEGRETOL) 200 MG tablet; Take 2 tablets (400 mg total) by mouth at bedtime.  Generalized anxiety disorder -     buPROPion (WELLBUTRIN XL) 150 MG 24 hr tablet; Take two tablets every morning.  Major depressive disorder, recurrent episode, moderate (HCC) -     buPROPion (WELLBUTRIN XL) 150 MG 24 hr tablet; Take two tablets every morning.  PTSD (post-traumatic stress disorder)     Please see After Visit Summary for patient specific instructions.  Future Appointments  Date Time Provider Department Center  11/07/2021  7:30 AM Wendling, Jilda RocheNicholas Paul, DO [facility]    No orders of the defined types were placed in this encounter.     -------------------------------

## 2021-08-20 ENCOUNTER — Encounter: Payer: Self-pay | Admitting: Family Medicine

## 2021-08-21 MED ORDER — MONTELUKAST SODIUM 10 MG PO TABS
ORAL_TABLET | ORAL | 1 refills | Status: AC
Start: 2021-08-21 — End: ?

## 2021-10-02 ENCOUNTER — Telehealth (INDEPENDENT_AMBULATORY_CARE_PROVIDER_SITE_OTHER): Payer: Federal, State, Local not specified - PPO | Admitting: Adult Health

## 2021-10-02 ENCOUNTER — Encounter: Payer: Self-pay | Admitting: Adult Health

## 2021-10-02 DIAGNOSIS — F319 Bipolar disorder, unspecified: Secondary | ICD-10-CM | POA: Diagnosis not present

## 2021-10-02 DIAGNOSIS — F4310 Post-traumatic stress disorder, unspecified: Secondary | ICD-10-CM

## 2021-10-02 DIAGNOSIS — F411 Generalized anxiety disorder: Secondary | ICD-10-CM | POA: Diagnosis not present

## 2021-10-02 DIAGNOSIS — Z79899 Other long term (current) drug therapy: Secondary | ICD-10-CM

## 2021-10-02 DIAGNOSIS — G4700 Insomnia, unspecified: Secondary | ICD-10-CM

## 2021-10-02 MED ORDER — TEMAZEPAM 30 MG PO CAPS: 30.0000 mg | ORAL_CAPSULE | Freq: Every day | ORAL | 2 refills | Status: DC

## 2021-10-02 NOTE — Progress Notes (Signed)
Christian Baldwin [id number] 08-31-1975 46 y.o.  Virtual Visit via Video Note  I connected with pt @ on 10/02/21 at  2:40 PM EDT by a video enabled telemedicine application and verified that I am speaking with the correct person using two identifiers.   I discussed the limitations of evaluation and management by telemedicine and the availability of in person appointments. The patient expressed understanding and agreed to proceed.  I discussed the assessment and treatment plan with the patient. The patient was provided an opportunity to ask questions and all were answered. The patient agreed with the plan and demonstrated an understanding of the instructions.   The patient was advised to call back or seek an in-person evaluation if the symptoms worsen or if the condition fails to improve as anticipated.  I provided 25 minutes of non-face-to-face time during this encounter.  The patient was located at home.  The provider was located at [facility].   Dorothyann Gibbs, NP   Subjective:   Patient ID:  Christian Baldwin is a 46 y.o. (DOB [birth year]) male.  Chief Complaint: No chief complaint on file.   HPI Nobel Brar presents for follow-up of GAD, MDD, insomnia, BPD-1, and PTSD  Describes mood today as "ok". Pleasant. Denies tearfulness. Mood symptoms - denies depression, anxiety or irritability. Denies racing thoughts. Stating "things are steady". Mood is consistent. Stating "I feel pretty even keeled". Feels like medications continue to work well. Family doing well. Stable interest and motivation. Taking medications as prescribed.  Energy levels good. Active, does not have a regular exercise routine.   Enjoys some usual interests and activities. Married. Lives with wife and 2 children. Spending time with family.  Appetite adequate. Weight gain 4 pounds - 204 from 200 pounds.  Sleeps well most nights. Averages 7.5 hours.  Focus and concentration stable. Completing  tasks. Managing aspects of household. Works 40 hours a week. Denies SI or HI.  Denies AH or VH.  Denies self harm. Denies substance use.   Past Psychiatric History: Admitted in March 2020 for mania after starting Zoloft.  Review of Systems:  Review of Systems  Musculoskeletal:  Negative for gait problem.  Neurological:  Negative for tremors.  Psychiatric/Behavioral:         Please refer to HPI    Medications: I have reviewed the patient's current medications.  Current Outpatient Medications  Medication Sig Dispense Refill   buPROPion (WELLBUTRIN XL) 150 MG 24 hr tablet Take two tablets every morning. 180 tablet 1   carbamazepine (TEGRETOL) 200 MG tablet Take 2 tablets (400 mg total) by mouth at bedtime. 180 tablet 1   fluticasone (FLONASE) 50 MCG/ACT nasal spray SPRAY 2 SPRAYS INTO EACH NOSTRIL EVERY DAY 48 mL 1   montelukast (SINGULAIR) 10 MG tablet TAKE 1 TABLET BY MOUTH EVERYDAY AT BEDTIME 90 tablet 1   omega-3 acid ethyl esters (LOVAZA) 1 g capsule Take 1 capsule (1 g total) by mouth 2 (two) times daily. 60 capsule 5   risperiDONE (RISPERDAL) 0.5 MG tablet Take 1 tablet (0.5 mg total) by mouth at bedtime. 90 tablet 1   risperiDONE (RISPERDAL) 1 MG tablet Take one tablet at bedtime. 90 tablet 1   sildenafil (VIAGRA) 100 MG tablet Take 0.5-1 tablets (50-100 mg total) by mouth daily as needed for erectile dysfunction. 30 tablet 2   temazepam (RESTORIL) 30 MG capsule Take 1 capsule (30 mg total) by mouth at bedtime. 30 capsule 2   VITAMIN D, CHOLECALCIFEROL, PO Take 1 capsule by  mouth daily.      No current facility-administered medications for this visit.    Medication Side Effects: None  Allergies: No Known Allergies  Past Medical History:  Diagnosis Date   Allergy    Bipolar 1 disorder (HCC)     Family History  Problem Relation Age of Onset   Cancer Neg Hx    Other Neg Hx        low testosterone    Social History   Socioeconomic History   Marital status:  Married    Spouse name: Not on file   Number of children: Not on file   Years of education: Not on file   Highest education level: Not on file  Occupational History   Not on file  Tobacco Use   Smoking status: Every Day    Packs/day: 0.50    Types: Cigarettes, E-cigarettes   Smokeless tobacco: Never  Substance and Sexual Activity   Alcohol use: Yes   Drug use: Not on file   Sexual activity: Not on file  Other Topics Concern   Not on file  Social History Narrative   Not on file   Social Determinants of Health   Financial Resource Strain: Not on file  Food Insecurity: Not on file  Transportation Needs: Not on file  Physical Activity: Not on file  Stress: Not on file  Social Connections: Not on file  Intimate Partner Violence: Not on file    Past Medical History, Surgical history, Social history, and Family history were reviewed and updated as appropriate.   Please see review of systems for further details on the patient's review from today.   Objective:   Physical Exam:  There were no vitals taken for this visit.  Physical Exam Constitutional:      General: He is not in acute distress. Musculoskeletal:        General: No deformity.  Neurological:     Mental Status: He is alert and oriented to person, place, and time.     Coordination: Coordination normal.  Psychiatric:        Attention and Perception: Attention and perception normal. He does not perceive auditory or visual hallucinations.        Mood and Affect: Mood normal. Mood is not anxious or depressed. Affect is not labile, blunt, angry or inappropriate.        Speech: Speech normal.        Behavior: Behavior normal.        Thought Content: Thought content normal. Thought content is not paranoid or delusional. Thought content does not include homicidal or suicidal ideation. Thought content does not include homicidal or suicidal plan.        Cognition and Memory: Cognition and memory normal.        Judgment:  Judgment normal.     Comments: Insight intact     Lab Review:     Component Value Date/Time   NA 140 11/05/2020 0745   K 4.4 11/05/2020 0745   CL 104 11/05/2020 0745   CO2 29 11/05/2020 0745   GLUCOSE 88 11/05/2020 0745   BUN 10 11/05/2020 0745   CREATININE 0.87 11/05/2020 0745   CALCIUM 9.0 11/05/2020 0745   PROT 6.9 11/05/2020 0745   ALBUMIN 4.6 11/05/2020 0745   AST 17 11/05/2020 0745   ALT 32 11/05/2020 0745   ALKPHOS 102 11/05/2020 0745   BILITOT 0.5 11/05/2020 0745   GFRNONAA >60 04/26/2018 1021   GFRAA >60 04/26/2018 1021  Component Value Date/Time   WBC 4.9 11/05/2020 0745   RBC 4.73 11/05/2020 0745   HGB 15.2 11/05/2020 0745   HCT 44.6 11/05/2020 0745   PLT 137.0 (L) 11/05/2020 0745   MCV 94.3 11/05/2020 0745   MCH 31.0 04/26/2018 1021   MCHC 34.1 11/05/2020 0745   RDW 12.1 11/05/2020 0745   LYMPHSABS 1.4 04/26/2018 1021   MONOABS 0.7 04/26/2018 1021   EOSABS 0.1 04/26/2018 1021   BASOSABS 0.0 04/26/2018 1021    No results found for: "POCLITH", "LITHIUM"   Lab Results  Component Value Date   CBMZ 6.3 01/08/2021     .res Assessment: Plan:    Plan:  1. Wellbutrin XL 150mg  - 2 every morning 2. Temazepam 30mg  at hs 3. Carbamazepine 200mg  BID - Sodium level 140 11/07/2020 4. Risperdal 1.5mg  at bedtime   Tegretol level - discussed will mail slip.  RTC 3 months  Patient advised to contact office with any questions, adverse effects, or acute worsening in signs and symptoms.  Discussed potential metabolic side effects associated with atypical antipsychotics, as well as potential risk for movement side effects. Adcontact office if movement side effects occur.   Discussed potential benefits, risk, and side effects of benzodiazepines to include potential risk of tolerance and dependence, as well as possibvised pt to le drowsiness.  Advised patient not to drive if experiencing drowsiness and to take lowest possible effective dose to minimize risk  of dependence and tolerance. There are no diagnoses linked to this encounter.  Diagnoses and all orders for this visit:  High risk medication use -     Carbamazepine level, total  Insomnia, unspecified type -     temazepam (RESTORIL) 30 MG capsule; Take 1 capsule (30 mg total) by mouth at bedtime.  Bipolar I disorder (HCC)  Generalized anxiety disorder  PTSD (post-traumatic stress disorder)     Please see After Visit Summary for patient specific instructions.  Future Appointments  Date Time Provider Department Center  11/07/2021  7:30 AM Wendling, , DO [facility]    Orders Placed This Encounter  Procedures   Carbamazepine level, total      -------------------------------

## 2021-11-07 ENCOUNTER — Ambulatory Visit (INDEPENDENT_AMBULATORY_CARE_PROVIDER_SITE_OTHER): Payer: Federal, State, Local not specified - PPO | Admitting: Family Medicine

## 2021-11-07 ENCOUNTER — Encounter: Payer: Self-pay | Admitting: Family Medicine

## 2021-11-07 VITALS — BP 122/82 | HR 49 | Temp 97.9°F | Ht 75.0 in | Wt 209.2 lb

## 2021-11-07 DIAGNOSIS — Z0000 Encounter for general adult medical examination without abnormal findings: Secondary | ICD-10-CM | POA: Diagnosis not present

## 2021-11-07 DIAGNOSIS — Z5181 Encounter for therapeutic drug level monitoring: Secondary | ICD-10-CM | POA: Diagnosis not present

## 2021-11-07 LAB — CBC
HCT: 44.6 % (ref 39.0–52.0)
Hemoglobin: 15.2 g/dL (ref 13.0–17.0)
MCHC: 34.1 g/dL (ref 30.0–36.0)
MCV: 94 fl (ref 78.0–100.0)
Platelets: 146 10*3/uL — ABNORMAL LOW (ref 150.0–400.0)
RBC: 4.75 Mil/uL (ref 4.22–5.81)
RDW: 12.2 % (ref 11.5–15.5)
WBC: 5.5 10*3/uL (ref 4.0–10.5)

## 2021-11-07 LAB — COMPREHENSIVE METABOLIC PANEL
ALT: 47 U/L (ref 0–53)
AST: 24 U/L (ref 0–37)
Albumin: 4.7 g/dL (ref 3.5–5.2)
Alkaline Phosphatase: 111 U/L (ref 39–117)
BUN: 12 mg/dL (ref 6–23)
CO2: 28 mEq/L (ref 19–32)
Calcium: 9.1 mg/dL (ref 8.4–10.5)
Chloride: 102 mEq/L (ref 96–112)
Creatinine, Ser: 0.91 mg/dL (ref 0.40–1.50)
GFR: 102.57 mL/min (ref >60.00)
Glucose, Bld: 90 mg/dL (ref 70–99)
Potassium: 4.3 mEq/L (ref 3.5–5.1)
Sodium: 139 mEq/L (ref 135–145)
Total Bilirubin: 0.5 mg/dL (ref 0.2–1.2)
Total Protein: 7.1 g/dL (ref 6.0–8.3)

## 2021-11-07 LAB — LIPID PANEL
Cholesterol: 193 mg/dL (ref 0–200)
HDL: 43.5 mg/dL (ref >39.00)
LDL Cholesterol: 121 mg/dL — ABNORMAL HIGH (ref 0–99)
NonHDL: 149.04
Total CHOL/HDL Ratio: 4
Triglycerides: 139 mg/dL (ref 0.0–149.0)
VLDL: 27.8 mg/dL (ref 0.0–40.0)

## 2021-11-07 MED ORDER — SILDENAFIL CITRATE 100 MG PO TABS: 50.0000 mg | ORAL_TABLET | Freq: Every day | ORAL | 2 refills | Status: AC | PRN

## 2021-11-07 NOTE — Progress Notes (Signed)
Chief Complaint  Patient presents with   Annual Exam    Well Male Christian Baldwin is here for a complete physical.   His last physical was >1 year ago.  Current diet: in general, a "healthy" diet.   Current exercise: none Weight trend: stable Fatigue out of ordinary? No. Seat belt? Yes.   Advanced directive? Unsure  Health maintenance Tetanus- Yes HIV- Yes Hep C- Yes  Past Medical History:  Diagnosis Date   Allergy    Bipolar 1 disorder ([place])      Past Surgical History:  Procedure Laterality Date   HERNIA REPAIR      Medications  Current Outpatient Medications on File Prior to Visit  Medication Sig Dispense Refill   buPROPion (WELLBUTRIN XL) 150 MG 24 hr tablet Take two tablets every morning. 180 tablet 1   carbamazepine (TEGRETOL) 200 MG tablet Take 2 tablets (400 mg total) by mouth at bedtime. 180 tablet 1   fluticasone (FLONASE) 50 MCG/ACT nasal spray SPRAY 2 SPRAYS INTO EACH NOSTRIL EVERY DAY 48 mL 1   montelukast (SINGULAIR) 10 MG tablet TAKE 1 TABLET BY MOUTH EVERYDAY AT BEDTIME 90 tablet 1   omega-3 acid ethyl esters (LOVAZA) 1 g capsule Take 1 capsule (1 g total) by mouth 2 (two) times daily. 60 capsule 5   risperiDONE (RISPERDAL) 0.5 MG tablet Take 1 tablet (0.5 mg total) by mouth at bedtime. 90 tablet 1   risperiDONE (RISPERDAL) 1 MG tablet Take one tablet at bedtime. 90 tablet 1   sildenafil (VIAGRA) 100 MG tablet Take 0.5-1 tablets (50-100 mg total) by mouth daily as needed for erectile dysfunction. 30 tablet 2   temazepam (RESTORIL) 30 MG capsule Take 1 capsule (30 mg total) by mouth at bedtime. 30 capsule 2   VITAMIN D, CHOLECALCIFEROL, PO Take 1 capsule by mouth daily.       Allergies No Known Allergies  Family History Family History  Problem Relation Age of Onset   Cancer Neg Hx    Other Neg Hx        low testosterone    Review of Systems: Constitutional: no fevers or chills Eye:  no recent significant change in  vision Ear/Nose/Mouth/Throat:  Ears:  no hearing loss Nose/Mouth/Throat:  no complaints of nasal congestion, no sore throat Cardiovascular:  no chest pain Respiratory:  no shortness of breath Gastrointestinal:  no abdominal pain, no change in bowel habits GU:  Male: negative for dysuria, frequency, and incontinence Musculoskeletal/Extremities:  no pain of the joints Integumentary (Skin/Breast):  no abnormal skin lesions reported Neurologic:  no headaches Endocrine: No unexpected weight changes Hematologic/Lymphatic:  no night sweats  Exam BP 122/82 (BP Location: Left Arm, Patient Position: Sitting, Cuff Size: Normal)   Pulse (!) 49   Temp 97.9 F (36.6 C) (Oral)   Ht 6\' 3"  (1.905 m)   Wt 209 lb 4 oz (94.9 kg)   SpO2 (!) 9%   BMI 26.15 kg/m  General:  well developed, well nourished, in no apparent distress Skin:  no significant moles, warts, or growths Head:  no masses, lesions, or tenderness Eyes:  pupils equal and round, sclera anicteric without injection Ears:  canals without lesions, TMs shiny without retraction, no obvious effusion, no erythema Nose:  nares patent, mucosa normal Throat/Pharynx:  lips and gingiva without lesion; tongue and uvula midline; non-inflamed pharynx; no exudates or postnasal drainage Neck: neck supple without adenopathy, thyromegaly, or masses Lungs:  clear to auscultation, breath sounds equal bilaterally, no respiratory distress Cardio:  regular rate and rhythm, no bruits, no LE edema Abdomen:  abdomen soft, nontender; bowel sounds normal; no masses or organomegaly Rectal: Deferred Musculoskeletal:  symmetrical muscle groups noted without atrophy or deformity Extremities:  no clubbing, cyanosis, or edema, no deformities, no skin discoloration Neuro:  gait normal; deep tendon reflexes normal and symmetric Psych: well oriented with normal range of affect and appropriate judgment/insight  Assessment and Plan  Well adult exam - Plan: CBC,  Comprehensive metabolic panel, Lipid panel  Therapeutic drug monitoring - Plan: Carbamazepine level, total   [name]. Counseled on diet and exercise. Counseled on risks and benefits of prostate cancer screening with PSA. The patient agrees to forego screening.  Ck Tegretol levels on behalf of psych.  Advanced directive form provided today.  Other orders as above. Follow up in 1 yr pending the above workup. The patient voiced understanding and agreement to the plan.  Jilda Roche [place], DO 11/07/21 7:39 AM

## 2021-11-07 NOTE — Addendum Note (Signed)
Addended by: Kelle Darting A on: 11/07/2021 07:59 AM   Modules accepted: Orders

## 2021-11-07 NOTE — Patient Instructions (Signed)
Give us 2-3 business days to get the results of your labs back.   Keep the diet clean and stay active.  Please get me a copy of your advanced directive form at your convenience.   Let us know if you need anything.  

## 2021-11-08 LAB — CARBAMAZEPINE LEVEL, TOTAL: Carbamazepine Lvl: 8.1 mg/L (ref 4.0–12.0)

## 2021-12-24 ENCOUNTER — Other Ambulatory Visit: Payer: Self-pay | Admitting: Adult Health

## 2021-12-24 ENCOUNTER — Telehealth: Payer: Self-pay

## 2021-12-24 DIAGNOSIS — F331 Major depressive disorder, recurrent, moderate: Secondary | ICD-10-CM

## 2021-12-24 DIAGNOSIS — F411 Generalized anxiety disorder: Secondary | ICD-10-CM

## 2021-12-24 DIAGNOSIS — F319 Bipolar disorder, unspecified: Secondary | ICD-10-CM

## 2021-12-24 MED ORDER — BUPROPION HCL ER (XL) 300 MG PO TB24: 300.0000 mg | ORAL_TABLET | Freq: Every day | ORAL | 0 refills | Status: DC

## 2022-01-05 ENCOUNTER — Telehealth: Payer: Federal, State, Local not specified - PPO | Admitting: Adult Health

## 2022-01-05 ENCOUNTER — Telehealth: Payer: Federal, State, Local not specified - PPO | Admitting: Physician Assistant

## 2022-01-05 ENCOUNTER — Encounter: Payer: Self-pay | Admitting: Adult Health

## 2022-01-05 DIAGNOSIS — F4310 Post-traumatic stress disorder, unspecified: Secondary | ICD-10-CM

## 2022-01-05 DIAGNOSIS — F319 Bipolar disorder, unspecified: Secondary | ICD-10-CM | POA: Diagnosis not present

## 2022-01-05 DIAGNOSIS — F331 Major depressive disorder, recurrent, moderate: Secondary | ICD-10-CM

## 2022-01-05 DIAGNOSIS — G4700 Insomnia, unspecified: Secondary | ICD-10-CM | POA: Diagnosis not present

## 2022-01-05 DIAGNOSIS — F411 Generalized anxiety disorder: Secondary | ICD-10-CM

## 2022-01-05 MED ORDER — RISPERIDONE 0.5 MG PO TABS: 0.5000 mg | ORAL_TABLET | Freq: Every day | ORAL | 1 refills | Status: DC

## 2022-01-05 MED ORDER — CARBAMAZEPINE 200 MG PO TABS: 400.0000 mg | ORAL_TABLET | Freq: Every day | ORAL | 1 refills | Status: DC

## 2022-01-05 MED ORDER — TEMAZEPAM 30 MG PO CAPS: 30.0000 mg | ORAL_CAPSULE | Freq: Every day | ORAL | 2 refills | Status: DC

## 2022-01-05 MED ORDER — BUPROPION HCL ER (XL) 300 MG PO TB24: 300.0000 mg | ORAL_TABLET | Freq: Every day | ORAL | 1 refills | Status: DC

## 2022-01-05 NOTE — Progress Notes (Signed)
Christian Baldwin [id number] 03-09-1975 46 y.o.  Virtual Visit via Video Note  I connected with pt @ on 01/05/22 at  2:40 PM EST by a video enabled telemedicine application and verified that I am speaking with the correct person using two identifiers.   I discussed the limitations of evaluation and management by telemedicine and the availability of in person appointments. The patient expressed understanding and agreed to proceed.  I discussed the assessment and treatment plan with the patient. The patient was provided an opportunity to ask questions and all were answered. The patient agreed with the plan and demonstrated an understanding of the instructions.   The patient was advised to call back or seek an in-person evaluation if the symptoms worsen or if the condition fails to improve as anticipated.  I provided 25 minutes of non-face-to-face time during this encounter.  The patient was located at home.  The provider was located at [facility].   Dorothyann Gibbs, NP   Subjective:   Patient ID:  Christian Baldwin is a 46 y.o. (DOB 23-Dec-1975) male.  Chief Complaint: No chief complaint on file.   HPI Saiquan Hands presents for follow-up of GAD, MDD, insomnia, BPD-1, and PTSD  Describes mood today as "ok". Pleasant. Denies tearfulness. Mood symptoms - denies depression, anxiety or irritability. Denies racing thoughts. Mood is consistent. Stating "things are going pretty good". Feels like medications continue to work well. Family doing well. Stable interest and motivation. Taking medications as prescribed.  Energy levels good. Active, does not have a regular exercise routine. Walking at work. Enjoys some usual interests and activities. Married. Lives with wife and 2 children. Spending time with family.  Appetite adequate. Weight gain - 211 pounds.  Sleeps well most nights. Averages 7.5 hours - using Temazepam 3 to 4 nights a week. Focus and concentration stable.  Completing tasks. Managing aspects of household. Works 40 hours a week. Denies SI or HI.  Denies AH or VH.  Denies self harm. Denies substance use.   Past Psychiatric History: Admitted in March 2020 for mania after starting Zoloft.   Review of Systems:  Review of Systems  Musculoskeletal:  Negative for gait problem.  Neurological:  Negative for tremors.  Psychiatric/Behavioral:         Please refer to HPI    Medications: I have reviewed the patient's current medications.  Current Outpatient Medications  Medication Sig Dispense Refill   buPROPion (WELLBUTRIN XL) 300 MG 24 hr tablet Take 1 tablet (300 mg total) by mouth daily. 30 tablet 0   carbamazepine (TEGRETOL) 200 MG tablet Take 2 tablets (400 mg total) by mouth at bedtime. 180 tablet 1   fluticasone (FLONASE) 50 MCG/ACT nasal spray SPRAY 2 SPRAYS INTO EACH NOSTRIL EVERY DAY 48 mL 1   montelukast (SINGULAIR) 10 MG tablet TAKE 1 TABLET BY MOUTH EVERYDAY AT BEDTIME 90 tablet 1   omega-3 acid ethyl esters (LOVAZA) 1 g capsule Take 1 capsule (1 g total) by mouth 2 (two) times daily. 60 capsule 5   risperiDONE (RISPERDAL) 0.5 MG tablet Take 1 tablet (0.5 mg total) by mouth at bedtime. 90 tablet 1   risperiDONE (RISPERDAL) 1 MG tablet TAKE 1 TABLET BY MOUTH EVERYDAY AT BEDTIME 90 tablet 1   sildenafil (VIAGRA) 100 MG tablet Take 0.5-1 tablets (50-100 mg total) by mouth daily as needed for erectile dysfunction. 30 tablet 2   temazepam (RESTORIL) 30 MG capsule Take 1 capsule (30 mg total) by mouth at bedtime. 30 capsule 2  VITAMIN D, CHOLECALCIFEROL, PO Take 1 capsule by mouth daily.      No current facility-administered medications for this visit.    Medication Side Effects: None  Allergies: No Known Allergies  Past Medical History:  Diagnosis Date   Allergy    Bipolar 1 disorder (HCC)     Family History  Problem Relation Age of Onset   Cancer Neg Hx    Other Neg Hx        low testosterone    Social History    Socioeconomic History   Marital status: Married    Spouse name: Not on file   Number of children: Not on file   Years of education: Not on file   Highest education level: Not on file  Occupational History   Not on file  Tobacco Use   Smoking status: Every Day    Packs/day: 0.50    Types: Cigarettes, E-cigarettes   Smokeless tobacco: Never  Substance and Sexual Activity   Alcohol use: Yes   Drug use: Not on file   Sexual activity: Not on file  Other Topics Concern   Not on file  Social History Narrative   Not on file   Social Determinants of Health   Financial Resource Strain: Not on file  Food Insecurity: Not on file  Transportation Needs: Not on file  Physical Activity: Not on file  Stress: Not on file  Social Connections: Not on file  Intimate Partner Violence: Not on file    Past Medical History, Surgical history, Social history, and Family history were reviewed and updated as appropriate.   Please see review of systems for further details on the patient's review from today.   Objective:   Physical Exam:  There were no vitals taken for this visit.  Physical Exam Constitutional:      General: He is not in acute distress. Musculoskeletal:        General: No deformity.  Neurological:     Mental Status: He is alert and oriented to person, place, and time.     Coordination: Coordination normal.  Psychiatric:        Attention and Perception: Attention and perception normal. He does not perceive auditory or visual hallucinations.        Mood and Affect: Mood normal. Mood is not anxious or depressed. Affect is not labile, blunt, angry or inappropriate.        Speech: Speech normal.        Behavior: Behavior normal.        Thought Content: Thought content normal. Thought content is not paranoid or delusional. Thought content does not include homicidal or suicidal ideation. Thought content does not include homicidal or suicidal plan.        Cognition and Memory:  Cognition and memory normal.        Judgment: Judgment normal.     Comments: Insight intact     Lab Review:     Component Value Date/Time   NA 139 11/07/2021 0743   K 4.3 11/07/2021 0743   CL 102 11/07/2021 0743   CO2 28 11/07/2021 0743   GLUCOSE 90 11/07/2021 0743   BUN 12 11/07/2021 0743   CREATININE 0.91 11/07/2021 0743   CALCIUM 9.1 11/07/2021 0743   PROT 7.1 11/07/2021 0743   ALBUMIN 4.7 11/07/2021 0743   AST 24 11/07/2021 0743   ALT 47 11/07/2021 0743   ALKPHOS 111 11/07/2021 0743   BILITOT 0.5 11/07/2021 0743   GFRNONAA >60 04/26/2018 1021  GFRAA >60 04/26/2018 1021       Component Value Date/Time   WBC 5.5 11/07/2021 0743   RBC 4.75 11/07/2021 0743   HGB 15.2 11/07/2021 0743   HCT 44.6 11/07/2021 0743   PLT 146.0 (L) 11/07/2021 0743   MCV 94.0 11/07/2021 0743   MCH 31.0 04/26/2018 1021   MCHC 34.1 11/07/2021 0743   RDW 12.2 11/07/2021 0743   LYMPHSABS 1.4 04/26/2018 1021   MONOABS 0.7 04/26/2018 1021   EOSABS 0.1 04/26/2018 1021   BASOSABS 0.0 04/26/2018 1021    No results found for: "POCLITH", "LITHIUM"   Lab Results  Component Value Date   CBMZ 8.1 11/07/2021     .res Assessment: Plan:    Plan:  1. Wellbutrin XL 300mg  -one tablet every morning 2. Temazepam 30mg  at hs - taking 3 to 4 days a week 3. Carbamazepine 200mg  BID - Sodium level 140 11/07/2020 4. Risperdal 1.5mg  at bedtime - 1mg  and 0.5mg  sent separately   Tegretol level - 8.1- 11/07/2021  RTC 3 months  Patient advised to contact office with any questions, adverse effects, or acute worsening in signs and symptoms.  Discussed potential metabolic side effects associated with atypical antipsychotics, as well as potential risk for movement side effects. Adcontact office if movement side effects occur.   Discussed potential benefits, risk, and side effects of benzodiazepines to include potential risk of tolerance and dependence, as well as possibvised pt to le drowsiness. Advised  patient not to drive if experiencing drowsiness and to take lowest possible effective dose to minimize risk of dependence and tolerance.  There are no diagnoses linked to this encounter.   Please see After Visit Summary for patient specific instructions.  Future Appointments  Date Time Provider Department Center  01/05/2022  2:40 PM Uva Runkel, 11/09/2020, NP [facility] None  11/09/2022  8:30 AM 11/09/2021, 01/07/2022, DO [facility]    No orders of the defined types were placed in this encounter.     -------------------------------

## 2022-04-07 ENCOUNTER — Telehealth (INDEPENDENT_AMBULATORY_CARE_PROVIDER_SITE_OTHER): Payer: Federal, State, Local not specified - PPO | Admitting: Adult Health

## 2022-04-07 ENCOUNTER — Encounter: Payer: Self-pay | Admitting: Adult Health

## 2022-04-07 DIAGNOSIS — F411 Generalized anxiety disorder: Secondary | ICD-10-CM

## 2022-04-07 DIAGNOSIS — F319 Bipolar disorder, unspecified: Secondary | ICD-10-CM | POA: Diagnosis not present

## 2022-04-07 DIAGNOSIS — F4310 Post-traumatic stress disorder, unspecified: Secondary | ICD-10-CM | POA: Diagnosis not present

## 2022-04-07 DIAGNOSIS — G4700 Insomnia, unspecified: Secondary | ICD-10-CM

## 2022-04-07 NOTE — Progress Notes (Signed)
Willem Cardiel [id number] 07/31/1975 47 y.o.  Virtual Visit via Video Note  I connected with pt @ on 04/07/22 at  2:40 PM EST by a video enabled telemedicine application and verified that I am speaking with the correct person using two identifiers.   I discussed the limitations of evaluation and management by telemedicine and the availability of in person appointments. The patient expressed understanding and agreed to proceed.  I discussed the assessment and treatment plan with the patient. The patient was provided an opportunity to ask questions and all were answered. The patient agreed with the plan and demonstrated an understanding of the instructions.   The patient was advised to call back or seek an in-person evaluation if the symptoms worsen or if the condition fails to improve as anticipated.  I provided 25 minutes of non-face-to-face time during this encounter.  The patient was located at home.  The provider was located at [facility].   Aloha Gell, NP   Subjective:   Patient ID:  Christian Baldwin is a 47 y.o. (DOB 1975/06/13) male.  Chief Complaint: No chief complaint on file.   HPI Francis Krygowski presents for follow-up of GAD, MDD, insomnia, BPD-1, and PTSD  Describes mood today as "ok". Pleasant. Denies tearfulness. Mood symptoms - denies depression, anxiety or irritability. Denies worry, rumination, and over thinking. Reports racing thoughts - "here and there - usually at night". Mood is consistent. Stating "I feel like I'm doing alright". Feels like medications continue to work well. Family doing well. Stable interest and motivation. Taking medications as prescribed.  Energy levels good. Active, does not have a regular exercise routine. Walks at work, taking the stairs, walking the dogs. Enjoys some usual interests and activities. Married. Lives with wife and 2 children. Spending time with family.  Appetite adequate. Weight gain - 211 to 214  pounds.  Sleeps well most nights. Averages 7.5 hours - using Temazepam. Focus and concentration stable. Completing tasks. Managing aspects of household. Works 40 hours a week. Denies SI or HI.  Denies AH or VH.  Denies self harm. Denies substance use.   Past Psychiatric History: Admitted in March 2020 for mania after starting Zoloft.  Review of Systems:  Review of Systems  Musculoskeletal:  Negative for gait problem.  Neurological:  Negative for tremors.  Psychiatric/Behavioral:         Please refer to HPI    Medications: I have reviewed the patient's current medications.  Current Outpatient Medications  Medication Sig Dispense Refill   buPROPion (WELLBUTRIN XL) 300 MG 24 hr tablet Take 1 tablet (300 mg total) by mouth daily. 90 tablet 1   carbamazepine (TEGRETOL) 200 MG tablet Take 2 tablets (400 mg total) by mouth at bedtime. 180 tablet 1   fluticasone (FLONASE) 50 MCG/ACT nasal spray SPRAY 2 SPRAYS INTO EACH NOSTRIL EVERY DAY 48 mL 1   montelukast (SINGULAIR) 10 MG tablet TAKE 1 TABLET BY MOUTH EVERYDAY AT BEDTIME 90 tablet 1   omega-3 acid ethyl esters (LOVAZA) 1 g capsule Take 1 capsule (1 g total) by mouth 2 (two) times daily. 60 capsule 5   risperiDONE (RISPERDAL) 0.5 MG tablet Take 1 tablet (0.5 mg total) by mouth at bedtime. 90 tablet 1   risperiDONE (RISPERDAL) 1 MG tablet TAKE 1 TABLET BY MOUTH EVERYDAY AT BEDTIME 90 tablet 1   sildenafil (VIAGRA) 100 MG tablet Take 0.5-1 tablets (50-100 mg total) by mouth daily as needed for erectile dysfunction. 30 tablet 2   temazepam (RESTORIL) 30  MG capsule Take 1 capsule (30 mg total) by mouth at bedtime. 30 capsule 2   VITAMIN D, CHOLECALCIFEROL, PO Take 1 capsule by mouth daily.      No current facility-administered medications for this visit.    Medication Side Effects: None  Allergies: No Known Allergies  Past Medical History:  Diagnosis Date   Allergy    Bipolar 1 disorder ([place])     Family History  Problem Relation  Age of Onset   Cancer Neg Hx    Other Neg Hx        low testosterone    Social History   Socioeconomic History   Marital status: Married    Spouse name: Not on file   Number of children: Not on file   Years of education: Not on file   Highest education level: Not on file  Occupational History   Not on file  Tobacco Use   Smoking status: Every Day    Packs/day: 0.50    Types: Cigarettes, E-cigarettes   Smokeless tobacco: Never  Substance and Sexual Activity   Alcohol use: Yes   Drug use: Not on file   Sexual activity: Not on file  Other Topics Concern   Not on file  Social History Narrative   Not on file   Social Determinants of Health   Financial Resource Strain: Not on file  Food Insecurity: Not on file  Transportation Needs: Not on file  Physical Activity: Not on file  Stress: Not on file  Social Connections: Not on file  Intimate Partner Violence: Not on file    Past Medical History, Surgical history, Social history, and Family history were reviewed and updated as appropriate.   Please see review of systems for further details on the patient's review from today.   Objective:   Physical Exam:  There were no vitals taken for this visit.  Physical Exam Constitutional:      General: He is not in acute distress. Musculoskeletal:        General: No deformity.  Neurological:     Mental Status: He is alert and oriented to person, place, and time.     Coordination: Coordination normal.  Psychiatric:        Attention and Perception: Attention and perception normal. He does not perceive auditory or visual hallucinations.        Mood and Affect: Mood normal. Mood is not anxious or depressed. Affect is not labile, blunt, angry or inappropriate.        Speech: Speech normal.        Behavior: Behavior normal.        Thought Content: Thought content normal. Thought content is not paranoid or delusional. Thought content does not include homicidal or suicidal ideation.  Thought content does not include homicidal or suicidal plan.        Cognition and Memory: Cognition and memory normal.        Judgment: Judgment normal.     Comments: Insight intact     Lab Review:     Component Value Date/Time   NA 139 11/07/2021 0743   K 4.3 11/07/2021 0743   CL 102 11/07/2021 0743   CO2 28 11/07/2021 0743   GLUCOSE 90 11/07/2021 0743   BUN 12 11/07/2021 0743   CREATININE 0.91 11/07/2021 0743   CALCIUM 9.1 11/07/2021 0743   PROT 7.1 11/07/2021 0743   ALBUMIN 4.7 11/07/2021 0743   AST 24 11/07/2021 0743   ALT 47 11/07/2021 0743  ALKPHOS 111 11/07/2021 0743   BILITOT 0.5 11/07/2021 0743   GFRNONAA >60 04/26/2018 1021   GFRAA >60 04/26/2018 1021       Component Value Date/Time   WBC 5.5 11/07/2021 0743   RBC 4.75 11/07/2021 0743   HGB 15.2 11/07/2021 0743   HCT 44.6 11/07/2021 0743   PLT 146.0 (L) 11/07/2021 0743   MCV 94.0 11/07/2021 0743   MCH 31.0 04/26/2018 1021   MCHC 34.1 11/07/2021 0743   RDW 12.2 11/07/2021 0743   LYMPHSABS 1.4 04/26/2018 1021   MONOABS 0.7 04/26/2018 1021   EOSABS 0.1 04/26/2018 1021   BASOSABS 0.0 04/26/2018 1021    No results found for: "POCLITH", "LITHIUM"   Lab Results  Component Value Date   CBMZ 8.1 11/07/2021     .res Assessment: Plan:    Plan:  1. Wellbutrin XL '300mg'$  - one tablet every morning 2. Temazepam '30mg'$  at hs - taking on nights he has to work. 3. Carbamazepine '200mg'$  BID  4. Risperdal 1.'5mg'$  at bedtime - '1mg'$  and 0.'5mg'$  sent separately   Tegretol level - 8.1- 11/07/2021  RTC 3 months  Patient advised to contact office with any questions, adverse effects, or acute worsening in signs and symptoms.  Discussed potential metabolic side effects associated with atypical antipsychotics, as well as potential risk for movement side effects. Adcontact office if movement side effects occur.   Discussed potential benefits, risk, and side effects of benzodiazepines to include potential risk of tolerance and  dependence, as well as possibvised pt to le drowsiness. Advised patient not to drive if experiencing drowsiness and to take lowest possible effective dose to minimize risk of dependence and tolerance.  There are no diagnoses linked to this encounter.   Please see After Visit Summary for patient specific instructions.  Future Appointments  Date Time Provider [place]  04/07/2022  2:40 PM Anna-Marie Coller, Berdie Ogren, NP [facility] None  11/09/2022  8:30 AM Nani Ravens, Crosby Oyster, DO [facility]    No orders of the defined types were placed in this encounter.     -------------------------------

## 2022-04-13 ENCOUNTER — Other Ambulatory Visit: Payer: Self-pay | Admitting: Adult Health

## 2022-04-13 DIAGNOSIS — F319 Bipolar disorder, unspecified: Secondary | ICD-10-CM

## 2022-07-08 ENCOUNTER — Encounter: Payer: Self-pay | Admitting: Adult Health

## 2022-07-08 ENCOUNTER — Telehealth: Payer: Federal, State, Local not specified - PPO | Admitting: Adult Health

## 2022-07-08 DIAGNOSIS — G4700 Insomnia, unspecified: Secondary | ICD-10-CM

## 2022-07-08 DIAGNOSIS — F319 Bipolar disorder, unspecified: Secondary | ICD-10-CM | POA: Diagnosis not present

## 2022-07-08 DIAGNOSIS — F4310 Post-traumatic stress disorder, unspecified: Secondary | ICD-10-CM | POA: Diagnosis not present

## 2022-07-08 DIAGNOSIS — F411 Generalized anxiety disorder: Secondary | ICD-10-CM

## 2022-07-08 DIAGNOSIS — F331 Major depressive disorder, recurrent, moderate: Secondary | ICD-10-CM

## 2022-07-08 MED ORDER — BUPROPION HCL ER (XL) 300 MG PO TB24: 300.0000 mg | ORAL_TABLET | Freq: Every day | ORAL | 1 refills | Status: AC

## 2022-07-08 MED ORDER — TEMAZEPAM 30 MG PO CAPS: 30.0000 mg | ORAL_CAPSULE | Freq: Every day | ORAL | 2 refills | Status: DC

## 2022-07-08 MED ORDER — CARBAMAZEPINE 200 MG PO TABS: 400.0000 mg | ORAL_TABLET | Freq: Every day | ORAL | 1 refills | Status: AC

## 2022-07-08 MED ORDER — RISPERIDONE 1 MG PO TABS: ORAL_TABLET | ORAL | 1 refills | Status: DC

## 2022-07-08 NOTE — Progress Notes (Signed)
[name] [id number] Nov 02, 1975 47 y.o.  Virtual Visit via Video Note  I connected with pt @ on 07/08/22 at  2:40 PM EDT by a video enabled telemedicine application and verified that I am speaking with the correct person using two identifiers.   I discussed the limitations of evaluation and management by telemedicine and the availability of in person appointments. The patient expressed understanding and agreed to proceed.  I discussed the assessment and treatment plan with the patient. The patient was provided an opportunity to ask questions and all were answered. The patient agreed with the plan and demonstrated an understanding of the instructions.   The patient was advised to call back or seek an in-person evaluation if the symptoms worsen or if the condition fails to improve as anticipated.  I provided 25 minutes of non-face-to-face time during this encounter.  The patient was located at home.  The provider was located at [facility].   Dorothyann Gibbs, NP   Subjective:   Patient ID:  Christian Baldwin is a 47 y.o. (DOB 1975-07-09) male.  Chief Complaint: No chief complaint on file.   HPI Christian Baldwin presents for follow-up of GAD, MDD, insomnia, BPD-1, and PTSD  Wife also on portion of call - feels he is doing well.   Describes mood today as "ok". Pleasant. Denies tearfulness. Mood symptoms - denies depression, anxiety or irritability. Denies worry, rumination, and over thinking. Denies racing thoughts. Mood is consistent. Stating "I've been doing fine". Feels like medications continue to work well. Family doing well. Stable interest and motivation. Taking medications as prescribed.  Energy levels good. Active, does not have a regular exercise routine. Enjoys some usual interests and activities. Married. Lives with wife and 2 children. Spending time with family.  Appetite adequate. Weight gain - 214 to 21 pounds.  Sleeps well most nights. Averages  7.5 hours - using Temazepam. Focus and concentration stable. Completing tasks. Managing aspects of household. Works 40 hours a week. Denies SI or HI.  Denies AH or VH.  Denies self harm. Denies substance use.   Past Psychiatric History: Admitted in March 2020 for mania after starting Zoloft.   Review of Systems:  Review of Systems  Musculoskeletal:  Negative for gait problem.  Neurological:  Negative for tremors.  Psychiatric/Behavioral:         Please refer to HPI    Medications: I have reviewed the patient's current medications.  Current Outpatient Medications  Medication Sig Dispense Refill   buPROPion (WELLBUTRIN XL) 300 MG 24 hr tablet Take 1 tablet (300 mg total) by mouth daily. 90 tablet 1   carbamazepine (TEGRETOL) 200 MG tablet Take 2 tablets (400 mg total) by mouth at bedtime. 180 tablet 1   fluticasone (FLONASE) 50 MCG/ACT nasal spray SPRAY 2 SPRAYS INTO EACH NOSTRIL EVERY DAY 48 mL 1   montelukast (SINGULAIR) 10 MG tablet TAKE 1 TABLET BY MOUTH EVERYDAY AT BEDTIME 90 tablet 1   omega-3 acid ethyl esters (LOVAZA) 1 g capsule Take 1 capsule (1 g total) by mouth 2 (two) times daily. 60 capsule 5   risperiDONE (RISPERDAL) 0.5 MG tablet Take 1 tablet (0.5 mg total) by mouth at bedtime. 90 tablet 1   risperiDONE (RISPERDAL) 1 MG tablet TAKE 1 TABLET BY MOUTH EVERYDAY AT BEDTIME 90 tablet 1   sildenafil (VIAGRA) 100 MG tablet Take 0.5-1 tablets (50-100 mg total) by mouth daily as needed for erectile dysfunction. 30 tablet 2   temazepam (RESTORIL) 30 MG capsule Take 1  capsule (30 mg total) by mouth at bedtime. 30 capsule 2   VITAMIN D, CHOLECALCIFEROL, PO Take 1 capsule by mouth daily.      No current facility-administered medications for this visit.    Medication Side Effects: None  Allergies: No Known Allergies  Past Medical History:  Diagnosis Date   Allergy    Bipolar 1 disorder ([facility])     Family History  Problem Relation Age of Onset   Cancer Neg Hx    Other  Neg Hx        low testosterone    Social History   Socioeconomic History   Marital status: Married    Spouse name: Not on file   Number of children: Not on file   Years of education: Not on file   Highest education level: Not on file  Occupational History   Not on file  Tobacco Use   Smoking status: Every Day    Packs/day: .5    Types: Cigarettes, E-cigarettes   Smokeless tobacco: Never  Substance and Sexual Activity   Alcohol use: Yes   Drug use: Not on file   Sexual activity: Not on file  Other Topics Concern   Not on file  Social History Narrative   Not on file   Social Determinants of Health   Financial Resource Strain: Not on file  Food Insecurity: Not on file  Transportation Needs: Not on file  Physical Activity: Not on file  Stress: Not on file  Social Connections: Not on file  Intimate Partner Violence: Not on file    Past Medical History, Surgical history, Social history, and Family history were reviewed and updated as appropriate.   Please see review of systems for further details on the patient's review from today.   Objective:   Physical Exam:  There were no vitals taken for this visit.  Physical Exam Constitutional:      General: He is not in acute distress. Musculoskeletal:        General: No deformity.  Neurological:     Mental Status: He is alert and oriented to person, place, and time.     Coordination: Coordination normal.  Psychiatric:        Attention and Perception: Attention and perception normal. He does not perceive auditory or visual hallucinations.        Mood and Affect: Mood normal. Mood is not anxious or depressed. Affect is not labile, blunt, angry or inappropriate.        Speech: Speech normal.        Behavior: Behavior normal.        Thought Content: Thought content normal. Thought content is not paranoid or delusional. Thought content does not include homicidal or suicidal ideation. Thought content does not include  homicidal or suicidal plan.        Cognition and Memory: Cognition and memory normal.        Judgment: Judgment normal.     Comments: Insight intact     Lab Review:     Component Value Date/Time   NA 139 11/07/2021 0743   K 4.3 11/07/2021 0743   CL 102 11/07/2021 0743   CO2 28 11/07/2021 0743   GLUCOSE 90 11/07/2021 0743   BUN 12 11/07/2021 0743   CREATININE 0.91 11/07/2021 0743   CALCIUM 9.1 11/07/2021 0743   PROT 7.1 11/07/2021 0743   ALBUMIN 4.7 11/07/2021 0743   AST 24 11/07/2021 0743   ALT 47 11/07/2021 0743   ALKPHOS 111 11/07/2021  0743   BILITOT 0.5 11/07/2021 0743   GFRNONAA >60 04/26/2018 1021   GFRAA >60 04/26/2018 1021       Component Value Date/Time   WBC 5.5 11/07/2021 0743   RBC 4.75 11/07/2021 0743   HGB 15.2 11/07/2021 0743   HCT 44.6 11/07/2021 0743   PLT 146.0 (L) 11/07/2021 0743   MCV 94.0 11/07/2021 0743   MCH 31.0 04/26/2018 1021   MCHC 34.1 11/07/2021 0743   RDW 12.2 11/07/2021 0743   LYMPHSABS 1.4 04/26/2018 1021   MONOABS 0.7 04/26/2018 1021   EOSABS 0.1 04/26/2018 1021   BASOSABS 0.0 04/26/2018 1021    No results found for: "POCLITH", "LITHIUM"   Lab Results  Component Value Date   CBMZ 8.1 11/07/2021     .res Assessment: Plan:    Plan:  1. Wellbutrin XL 300mg  - one tablet every morning 2. Temazepam 30mg  at hs - taking on nights he has to work. 3. Carbamazepine 200mg  BID  4. Risperdal 1mg  at bedtime.  Tegretol level - 8.1- 11/07/2021  RTC 3 months  Patient advised to contact office with any questions, adverse effects, or acute worsening in signs and symptoms.  Discussed potential metabolic side effects associated with atypical antipsychotics, as well as potential risk for movement side effects. Adcontact office if movement side effects occur.   Discussed potential benefits, risk, and side effects of benzodiazepines to include potential risk of tolerance and dependence, as well as possibvised pt to le drowsiness. Advised  patient not to drive if experiencing drowsiness and to take lowest possible effective dose to minimize risk of dependence and tolerance.  There are no diagnoses linked to this encounter.   Please see After Visit Summary for patient specific instructions.  Future Appointments  Date Time Provider Department Center  07/08/2022  2:40 PM Christian Baldwin, Thereasa Solo, NP [facility] None  11/09/2022  8:30 AM Carmelia Roller, Jilda Roche, DO [facility]    No orders of the defined types were placed in this encounter.     -------------------------------

## 2022-07-25 ENCOUNTER — Other Ambulatory Visit: Payer: Self-pay | Admitting: Adult Health

## 2022-07-25 DIAGNOSIS — F319 Bipolar disorder, unspecified: Secondary | ICD-10-CM

## 2022-09-24 ENCOUNTER — Encounter (INDEPENDENT_AMBULATORY_CARE_PROVIDER_SITE_OTHER): Payer: Self-pay

## 2022-10-09 ENCOUNTER — Telehealth: Payer: Federal, State, Local not specified - PPO | Admitting: Adult Health

## 2022-10-20 ENCOUNTER — Encounter: Payer: Self-pay | Admitting: Adult Health

## 2022-10-20 ENCOUNTER — Telehealth: Payer: Federal, State, Local not specified - PPO | Admitting: Adult Health

## 2022-10-20 DIAGNOSIS — F319 Bipolar disorder, unspecified: Secondary | ICD-10-CM

## 2022-10-20 DIAGNOSIS — G4700 Insomnia, unspecified: Secondary | ICD-10-CM | POA: Diagnosis not present

## 2022-10-20 DIAGNOSIS — F411 Generalized anxiety disorder: Secondary | ICD-10-CM | POA: Diagnosis not present

## 2022-10-20 MED ORDER — TEMAZEPAM 30 MG PO CAPS: 30.0000 mg | ORAL_CAPSULE | Freq: Every day | ORAL | 2 refills | Status: AC

## 2022-10-20 NOTE — Progress Notes (Signed)
Christian Baldwin [id number] 1975-04-08 47 y.o.  Virtual Visit via Video Note  I connected with pt @ on 10/20/22 at  2:40 PM EDT by a video enabled telemedicine application and verified that I am speaking with the correct person using two identifiers.   I discussed the limitations of evaluation and management by telemedicine and the availability of in person appointments. The patient expressed understanding and agreed to proceed.  I discussed the assessment and treatment plan with the patient. The patient was provided an opportunity to ask questions and all were answered. The patient agreed with the plan and demonstrated an understanding of the instructions.   The patient was advised to call back or seek an in-person evaluation if the symptoms worsen or if the condition fails to improve as anticipated.  I provided 25 minutes of non-face-to-face time during this encounter.  The patient was located at home.  The provider was located at [facility].   Dorothyann Gibbs, NP   Subjective:   Patient ID:  Christian Baldwin is a 47 y.o. (DOB [birth year]) male.  Chief Complaint: No chief complaint on file.   HPI Christian Baldwin presents for follow-up of GAD, insomnia and BPD-1.  Wife also on portion of call - feels he is doing well.   Describes mood today as "ok". Pleasant. Denies tearfulness. Mood symptoms - denies depression, anxiety or irritability. Denies panic attacks. Denies worry, rumination. Reports some over thinking. Denies racing thoughts - "very little". Mood is consistent. Stating "I feel like I've been doing ok". Feels like medications continue to work well. Family doing well. Stable interest and motivation. Taking medications as prescribed.  Energy levels good. Active, does not have a regular exercise routine - walking at work..  Enjoys some usual interests and activities. Married. Lives with wife and 2 children. Spending time with family.  Appetite adequate.  Weight stable - 215 pounds.  Sleeps well most nights. Averages 7.5 hours - using Temazepam. Focus and concentration stable. Completing tasks. Managing aspects of household. Works 40 hours a week. Denies SI or HI.  Denies AH or VH.  Denies self harm. Denies substance use.   Past Psychiatric History: Admitted in March 2020 for mania after starting Zoloft.   Review of Systems:  Review of Systems  Musculoskeletal:  Negative for gait problem.  Neurological:  Negative for tremors.  Psychiatric/Behavioral:         Please refer to HPI    Medications: I have reviewed the patient's current medications.  Current Outpatient Medications  Medication Sig Dispense Refill   buPROPion (WELLBUTRIN XL) 300 MG 24 hr tablet Take 1 tablet (300 mg total) by mouth daily. 90 tablet 1   carbamazepine (TEGRETOL) 200 MG tablet Take 2 tablets (400 mg total) by mouth at bedtime. 180 tablet 1   fluticasone (FLONASE) 50 MCG/ACT nasal spray SPRAY 2 SPRAYS INTO EACH NOSTRIL EVERY DAY 48 mL 1   montelukast (SINGULAIR) 10 MG tablet TAKE 1 TABLET BY MOUTH EVERYDAY AT BEDTIME 90 tablet 1   omega-3 acid ethyl esters (LOVAZA) 1 g capsule Take 1 capsule (1 g total) by mouth 2 (two) times daily. 60 capsule 5   risperiDONE (RISPERDAL) 0.5 MG tablet Take 1 tablet (0.5 mg total) by mouth at bedtime. 90 tablet 1   risperiDONE (RISPERDAL) 1 MG tablet TAKE 1 TABLET BY MOUTH EVERYDAY AT BEDTIME 90 tablet 1   sildenafil (VIAGRA) 100 MG tablet Take 0.5-1 tablets (50-100 mg total) by mouth daily as needed for erectile dysfunction. 30  tablet 2   temazepam (RESTORIL) 30 MG capsule Take 1 capsule (30 mg total) by mouth at bedtime. 30 capsule 2   VITAMIN D, CHOLECALCIFEROL, PO Take 1 capsule by mouth daily.      No current facility-administered medications for this visit.    Medication Side Effects: None  Allergies: No Known Allergies  Past Medical History:  Diagnosis Date   Allergy    Bipolar 1 disorder (HCC)     Family  History  Problem Relation Age of Onset   Cancer Neg Hx    Other Neg Hx        low testosterone    Social History   Socioeconomic History   Marital status: Married    Spouse name: Not on file   Number of children: Not on file   Years of education: Not on file   Highest education level: Not on file  Occupational History   Not on file  Tobacco Use   Smoking status: Every Day    Current packs/day: 0.50    Types: Cigarettes, E-cigarettes   Smokeless tobacco: Never  Substance and Sexual Activity   Alcohol use: Yes   Drug use: Not on file   Sexual activity: Not on file  Other Topics Concern   Not on file  Social History Narrative   Not on file   Social Determinants of Health   Financial Resource Strain: Not on file  Food Insecurity: Not on file  Transportation Needs: Not on file  Physical Activity: Not on file  Stress: Not on file  Social Connections: Not on file  Intimate Partner Violence: Not on file    Past Medical History, Surgical history, Social history, and Family history were reviewed and updated as appropriate.   Please see review of systems for further details on the patient's review from today.   Objective:   Physical Exam:  There were no vitals taken for this visit.  Physical Exam Constitutional:      General: He is not in acute distress. Musculoskeletal:        General: No deformity.  Neurological:     Mental Status: He is alert and oriented to person, place, and time.     Coordination: Coordination normal.  Psychiatric:        Attention and Perception: Attention and perception normal. He does not perceive auditory or visual hallucinations.        Mood and Affect: Mood normal. Mood is not anxious or depressed. Affect is not labile, blunt, angry or inappropriate.        Speech: Speech normal.        Behavior: Behavior normal.        Thought Content: Thought content normal. Thought content is not paranoid or delusional. Thought content does not  include homicidal or suicidal ideation. Thought content does not include homicidal or suicidal plan.        Cognition and Memory: Cognition and memory normal.        Judgment: Judgment normal.     Comments: Insight intact     Lab Review:     Component Value Date/Time   NA 139 11/07/2021 0743   K 4.3 11/07/2021 0743   CL 102 11/07/2021 0743   CO2 28 11/07/2021 0743   GLUCOSE 90 11/07/2021 0743   BUN 12 11/07/2021 0743   CREATININE 0.91 11/07/2021 0743   CALCIUM 9.1 11/07/2021 0743   PROT 7.1 11/07/2021 0743   ALBUMIN 4.7 11/07/2021 0743   AST 24 11/07/2021  0743   ALT 47 11/07/2021 0743   ALKPHOS 111 11/07/2021 0743   BILITOT 0.5 11/07/2021 0743   GFRNONAA >60 04/26/2018 1021   GFRAA >60 04/26/2018 1021       Component Value Date/Time   WBC 5.5 11/07/2021 0743   RBC 4.75 11/07/2021 0743   HGB 15.2 11/07/2021 0743   HCT 44.6 11/07/2021 0743   PLT 146.0 (L) 11/07/2021 0743   MCV 94.0 11/07/2021 0743   MCH 31.0 04/26/2018 1021   MCHC 34.1 11/07/2021 0743   RDW 12.2 11/07/2021 0743   LYMPHSABS 1.4 04/26/2018 1021   MONOABS 0.7 04/26/2018 1021   EOSABS 0.1 04/26/2018 1021   BASOSABS 0.0 04/26/2018 1021    No results found for: "POCLITH", "LITHIUM"   Lab Results  Component Value Date   CBMZ 8.1 11/07/2021     .res Assessment: Plan:    Plan:  1. Wellbutrin XL 300mg  - one tablet every morning 2. Temazepam 30mg  at hs - taking on nights he has to work. 3. Carbamazepine 200mg  BID  4. Risperdal 1mg  at bedtime.  Tegretol level - 8.1- 11/07/2021  RTC 3 months  Patient advised to contact office with any questions, adverse effects, or acute worsening in signs and symptoms.  Discussed potential metabolic side effects associated with atypical antipsychotics, as well as potential risk for movement side effects. Adcontact office if movement side effects occur.   Discussed potential benefits, risk, and side effects of benzodiazepines to include potential risk of  tolerance and dependence, as well as possibvised pt to le drowsiness. Advised patient not to drive if experiencing drowsiness and to take lowest possible effective dose to minimize risk of dependence and tolerance.  There are no diagnoses linked to this encounter.   Please see After Visit Summary for patient specific instructions.  Future Appointments  Date Time Provider Department Center  10/20/2022  2:40 PM Reynoldo Mainer, Thereasa Solo, NP [facility] None    No orders of the defined types were placed in this encounter.     -------------------------------

## 2022-11-09 ENCOUNTER — Encounter: Payer: Federal, State, Local not specified - PPO | Admitting: Family Medicine

## 2022-12-20 ENCOUNTER — Other Ambulatory Visit: Payer: Self-pay | Admitting: Adult Health

## 2022-12-20 DIAGNOSIS — F319 Bipolar disorder, unspecified: Secondary | ICD-10-CM

## 2023-01-21 ENCOUNTER — Ambulatory Visit: Payer: Federal, State, Local not specified - PPO | Admitting: Adult Health

## 2023-08-25 ENCOUNTER — Encounter: Payer: Self-pay | Admitting: Family Medicine
